# Patient Record
Sex: Female | Born: 1970
Health system: Southern US, Community
[De-identification: ages and names within clinical notes are randomized; demographics above are authoritative.]

## PROBLEM LIST (undated history)

## (undated) DIAGNOSIS — K219 Gastro-esophageal reflux disease without esophagitis: Secondary | ICD-10-CM

## (undated) DIAGNOSIS — F1210 Cannabis abuse, uncomplicated: Secondary | ICD-10-CM

## (undated) DIAGNOSIS — I10 Essential (primary) hypertension: Secondary | ICD-10-CM

## (undated) DIAGNOSIS — Z720 Tobacco use: Secondary | ICD-10-CM

## (undated) DIAGNOSIS — K859 Acute pancreatitis without necrosis or infection, unspecified: Secondary | ICD-10-CM

---

## 2002-05-06 ENCOUNTER — Emergency Department (HOSPITAL_COMMUNITY): Admission: EM | Admit: 2002-05-06 | Discharge: 2002-05-06 | Payer: Self-pay | Admitting: Emergency Medicine

## 2002-05-06 ENCOUNTER — Encounter: Payer: Self-pay | Admitting: Emergency Medicine

## 2002-10-09 ENCOUNTER — Ambulatory Visit (HOSPITAL_COMMUNITY): Admission: RE | Admit: 2002-10-09 | Discharge: 2002-10-09 | Payer: Self-pay | Admitting: *Deleted

## 2002-10-09 ENCOUNTER — Encounter: Admission: RE | Admit: 2002-10-09 | Discharge: 2002-10-09 | Payer: Self-pay | Admitting: *Deleted

## 2019-01-18 ENCOUNTER — Encounter (HOSPITAL_COMMUNITY): Payer: Self-pay | Admitting: Emergency Medicine

## 2019-01-18 ENCOUNTER — Other Ambulatory Visit: Payer: Self-pay

## 2019-01-18 ENCOUNTER — Emergency Department (HOSPITAL_COMMUNITY)
Admission: EM | Admit: 2019-01-18 | Discharge: 2019-01-18 | Disposition: A | Discharge status: Home / Self Care | Payer: Self-pay | Attending: Emergency Medicine | Admitting: Emergency Medicine

## 2019-01-18 DIAGNOSIS — R1013 Epigastric pain: Secondary | ICD-10-CM | POA: Insufficient documentation

## 2019-01-18 MED ORDER — ALUM & MAG HYDROXIDE-SIMETH 200-200-20 MG/5ML PO SUSP
15.0000 mL | Freq: Once | ORAL | Status: AC
  Administered 2019-01-18: 15 mL via ORAL
  Filled 2019-01-18: qty 30

## 2019-01-18 MED ORDER — OMEPRAZOLE 20 MG PO CPDR: 20.0000 mg | DELAYED_RELEASE_CAPSULE | Freq: Every day | ORAL | 0 refills | Status: DC

## 2019-01-18 MED ORDER — SODIUM CHLORIDE 0.9% FLUSH: 3.0000 mL | Freq: Once | INTRAVENOUS | Status: DC

## 2019-01-18 MED ORDER — LIDOCAINE VISCOUS HCL 2 % MT SOLN
15.0000 mL | Freq: Once | OROMUCOSAL | Status: AC
  Administered 2019-01-18: 15 mL via OROMUCOSAL
  Filled 2019-01-18: qty 15

## 2019-01-18 NOTE — Discharge Instructions (Addendum)
Today, we recommended you have lab work drawn, however you declined and requested to go home instead.   You can continue taking the Gaviscon as needed for symptoms. Begin taking the omeprazole daily. Avoid spicy foods, acidic foods, NSAIDs such as ibuprofen/aspirin/Aleve -these can all worsen your symptoms. Remain sitting upright for at least 45 minutes to 1 hour after meals. Establish primary care she can follow-up on your high blood pressure and your recurrent acid reflux symptoms.  Return to the emergency department if you develop severely worsening pain, fever, or new or concerning symptoms.

## 2019-01-18 NOTE — ED Notes (Signed)
Per provider labs are not needed on this patient.

## 2019-01-18 NOTE — ED Triage Notes (Signed)
Per pt, states she is having severe acid reflux-took something but no relief-states symptoms occur when ever she is stressed

## 2019-01-18 NOTE — ED Provider Notes (Signed)
[facility]-EMERGENCY DEPT Provider Note   CSN: [id number] Arrival date & time: 01/18/19  1514    History   Chief Complaint Chief Complaint  Patient presents with  . Abdominal Pain    HPI Sarah Burch is a 48 y.o. female with PMHx GERD, HTN, presenting to the ED with complaint of epigastric burning abdominal pain without radiation.  Symptoms exacerbated by increased stress and anxiety at home.  She states this has caused similar symptoms in the past.  Treated with  gaviscon with some improvement.  She used to take Prevacid for this, however has not purchased any because she is waiting for a paycheck in the next few days.  Feels similar to previous flares of reflux.  Tums are better with laying down and resting, worse with eating, though generally constant burning pain. No hx of abd surgeries. No assoc N/V/D/C, fever, cough.  She does smoke daily.  She has a history of high blood pressure, however has not treated it because she has not had a PCP.     The history is provided by the patient.    History reviewed. No pertinent past medical history.  There are no active problems to display for this patient.   History reviewed. No pertinent surgical history.   OB History   No obstetric history on file.      Home Medications    Prior to Admission medications   Medication Sig Start Date End Date Taking? Authorizing Provider  omeprazole (PRILOSEC) 20 MG capsule Take 1 capsule (20 mg total) by mouth daily. 01/18/19   Jenia Klepper, [place] N, PA-C    Family History No family history on file.  Social History Social History   Tobacco Use  . Smoking status: Not on file  Substance Use Topics  . Alcohol use: Not on file  . Drug use: Not on file     Allergies   Patient has no allergy information on record.   Review of Systems Review of Systems  Constitutional: Negative for fever.  Gastrointestinal: Positive for abdominal pain. Negative for constipation,  diarrhea, nausea and vomiting.  Genitourinary: Negative for dysuria and frequency.  All other systems reviewed and are negative.    Physical Exam Updated Vital Signs BP (!) 173/103   Pulse 91   Temp 98.6 F (37 C)   Resp 18   SpO2 99%   Physical Exam Vitals signs and nursing note reviewed.  Constitutional:      Appearance: She is well-developed.  HENT:     Head: Normocephalic and atraumatic.     Mouth/Throat:     Mouth: Mucous membranes are moist.     Pharynx: Oropharynx is clear.  Eyes:     Conjunctiva/sclera: Conjunctivae normal.  Cardiovascular:     Rate and Rhythm: Regular rhythm. Tachycardia present.     Pulses: Normal pulses.     Heart sounds: Normal heart sounds.  Pulmonary:     Effort: Pulmonary effort is normal. No respiratory distress.     Breath sounds: Normal breath sounds.  Abdominal:     General: Abdomen is flat. Bowel sounds are normal. There is no distension.     Palpations: Abdomen is soft.     Tenderness: There is abdominal tenderness in the epigastric area. There is no guarding or rebound. Negative signs include Murphy's sign.  Skin:    General: Skin is warm.  Neurological:     Mental Status: She is alert.  Psychiatric:  Behavior: Behavior normal.      ED Treatments / Results  Labs (all labs ordered are listed, but only abnormal results are displayed) Labs Reviewed  LIPASE, BLOOD  COMPREHENSIVE METABOLIC PANEL  CBC  URINALYSIS, ROUTINE W REFLEX MICROSCOPIC  I-STAT BETA HCG BLOOD, ED ([facility], [facility], [facility] ONLY)    EKG EKG Interpretation  Date/Time:  Friday January 18 2019 15:25:08 EDT Ventricular Rate:  116 PR Interval:    QRS Duration: 62 QT Interval:  364 QTC Calculation: 506 R Axis:   87 Text Interpretation:  Sinus tachycardia Probable left atrial enlargement Anteroseptal infarct, old Borderline ST depression, diffuse leads Confirmed by Lennice Sites [phone]) on 01/18/2019 4:38:13 PM   Radiology No results found.  Procedures  Procedures (including critical care time)  Medications Ordered in ED Medications  sodium chloride flush (NS) 0.9 % injection 3 mL (has no administration in time range)  alum & mag hydroxide-simeth (MAALOX/MYLANTA) 200-200-20 MG/5ML suspension 15 mL (has no administration in time range)  lidocaine (XYLOCAINE) 2 % viscous mouth solution 15 mL (has no administration in time range)     Initial Impression / Assessment and Plan / ED Course  I have reviewed the triage vital signs and the nursing notes.  Pertinent labs & imaging results that were available during my care of the patient were reviewed by me and considered in my medical decision making (see chart for details).        Patient with history of GERD, presenting with complaint of flare of her GERD which is attributed to increased stress and anxiety at home.  Symptoms are very typical of previous episodes.  She is treated with Gaviscon at home, however has not tried any other over-the-counter medications because she has not had a paycheck yet.  Abdominal exam with some tenderness in the epigastrium, however no peritoneal signs.  Vital signs reveal some slight tachycardia on initial evaluation, however she does appear anxious.  She is also hypertensive though improving throughout visit.  Symptoms suspicious for gastritis versus PUD.  Recommend labs to rule out other causes of symptoms such as pancreatitis however patient declined any lab work today.  Discussed risk of missed diagnosis.  She verbalized understanding.  Patient treated with GI cocktail in the ED, will discharge with PPI and diet modifications.  Return precautions discussed.  Encourage PCP.  Patient discharged.  Patient discussed with Dr. Ronnald Nian.  Discussed results, findings, treatment and follow up. Patient advised of return precautions. Patient verbalized understanding and agreed with plan.   Final Clinical Impressions(s) / ED Diagnoses   Final diagnoses:  Epigastric  abdominal pain    ED Discharge Orders         Ordered    omeprazole (PRILOSEC) 20 MG capsule  Daily     01/18/19 [place], [place] N, [place] 01/18/19 [place], [place], DO 01/18/19 1902

## 2019-01-25 ENCOUNTER — Inpatient Hospital Stay (HOSPITAL_COMMUNITY)
Admission: EM | Admit: 2019-01-25 | Discharge: 2019-01-31 | DRG: 440 | Disposition: A | Discharge status: Home / Self Care | Payer: Self-pay | Attending: Family Medicine | Admitting: Family Medicine

## 2019-01-25 ENCOUNTER — Other Ambulatory Visit: Payer: Self-pay

## 2019-01-25 ENCOUNTER — Encounter (HOSPITAL_COMMUNITY): Payer: Self-pay | Admitting: *Deleted

## 2019-01-25 ENCOUNTER — Emergency Department (HOSPITAL_COMMUNITY): Payer: Self-pay

## 2019-01-25 DIAGNOSIS — K851 Biliary acute pancreatitis without necrosis or infection: Secondary | ICD-10-CM

## 2019-01-25 DIAGNOSIS — K861 Other chronic pancreatitis: Secondary | ICD-10-CM | POA: Diagnosis present

## 2019-01-25 DIAGNOSIS — R1011 Right upper quadrant pain: Secondary | ICD-10-CM

## 2019-01-25 DIAGNOSIS — I160 Hypertensive urgency: Secondary | ICD-10-CM | POA: Diagnosis present

## 2019-01-25 DIAGNOSIS — K859 Acute pancreatitis without necrosis or infection, unspecified: Principal | ICD-10-CM | POA: Diagnosis present

## 2019-01-25 DIAGNOSIS — F17200 Nicotine dependence, unspecified, uncomplicated: Secondary | ICD-10-CM | POA: Diagnosis present

## 2019-01-25 DIAGNOSIS — K219 Gastro-esophageal reflux disease without esophagitis: Secondary | ICD-10-CM | POA: Diagnosis present

## 2019-01-25 DIAGNOSIS — Z20828 Contact with and (suspected) exposure to other viral communicable diseases: Secondary | ICD-10-CM | POA: Diagnosis present

## 2019-01-25 LAB — COMPREHENSIVE METABOLIC PANEL
ALT: 86 U/L — ABNORMAL HIGH (ref 0–44)
AST: 220 U/L — ABNORMAL HIGH (ref 15–41)
Albumin: 3.3 g/dL — ABNORMAL LOW (ref 3.5–5.0)
Alkaline Phosphatase: 98 U/L (ref 38–126)
Anion gap: 9 (ref 5–15)
BUN: 6 mg/dL (ref 6–20)
CO2: 22 mmol/L (ref 22–32)
Calcium: 9 mg/dL (ref 8.9–10.3)
Chloride: 104 mmol/L (ref 98–111)
Creatinine, Ser: 0.77 mg/dL (ref 0.44–1.00)
GFR calc Af Amer: >60 mL/min (ref >60)
GFR calc non Af Amer: >60 mL/min (ref >60)
Glucose, Bld: 119 mg/dL — ABNORMAL HIGH (ref 70–99)
Potassium: 3.8 mmol/L (ref 3.5–5.1)
Sodium: 135 mmol/L (ref 135–145)
Total Bilirubin: 0.9 mg/dL (ref 0.3–1.2)
Total Protein: 6.5 g/dL (ref 6.5–8.1)

## 2019-01-25 LAB — CBC
HCT: 39.5 % (ref 36.0–46.0)
Hemoglobin: 13.7 g/dL (ref 12.0–15.0)
MCH: 35.5 pg — ABNORMAL HIGH (ref 26.0–34.0)
MCHC: 34.7 g/dL (ref 30.0–36.0)
MCV: 102.3 fL — ABNORMAL HIGH (ref 80.0–100.0)
Platelets: 398 10*3/uL (ref 150–400)
RBC: 3.86 MIL/uL — ABNORMAL LOW (ref 3.87–5.11)
RDW: 15.2 % (ref 11.5–15.5)
WBC: 10.7 10*3/uL — ABNORMAL HIGH (ref 4.0–10.5)
nRBC: 0 % (ref 0.0–0.2)

## 2019-01-25 LAB — URINALYSIS, ROUTINE W REFLEX MICROSCOPIC
Bilirubin Urine: NEGATIVE
Glucose, UA: NEGATIVE mg/dL
Hgb urine dipstick: NEGATIVE
Ketones, ur: NEGATIVE mg/dL
Nitrite: NEGATIVE
Protein, ur: 30 mg/dL — AB
Specific Gravity, Urine: 1.033 — ABNORMAL HIGH (ref 1.005–1.030)
WBC, UA: >50 WBC/hpf — ABNORMAL HIGH (ref 0–5)
pH: 5 (ref 5.0–8.0)

## 2019-01-25 LAB — SARS CORONAVIRUS 2 BY RT PCR (HOSPITAL ORDER, PERFORMED IN ~~LOC~~ HOSPITAL LAB): SARS Coronavirus 2: NEGATIVE

## 2019-01-25 LAB — I-STAT BETA HCG BLOOD, ED (MC, WL, AP ONLY): I-stat hCG, quantitative: 7.6 m[IU]/mL — ABNORMAL HIGH (ref <5)

## 2019-01-25 LAB — TRIGLYCERIDES: Triglycerides: 64 mg/dL (ref <150)

## 2019-01-25 LAB — LIPASE, BLOOD: Lipase: 207 U/L — ABNORMAL HIGH (ref 11–51)

## 2019-01-25 LAB — PREGNANCY, URINE: Preg Test, Ur: NEGATIVE

## 2019-01-25 MED ORDER — ACETAMINOPHEN 650 MG RE SUPP: 650.0000 mg | Freq: Four times a day (QID) | RECTAL | Status: DC | PRN

## 2019-01-25 MED ORDER — MORPHINE SULFATE (PF) 4 MG/ML IV SOLN
4.0000 mg | Freq: Once | INTRAVENOUS | Status: AC
  Administered 2019-01-25: 17:00:00 4 mg via INTRAVENOUS
  Filled 2019-01-25: qty 1

## 2019-01-25 MED ORDER — ONDANSETRON HCL 4 MG/2ML IJ SOLN
4.0000 mg | Freq: Four times a day (QID) | INTRAMUSCULAR | Status: DC | PRN
  Administered 2019-01-25 – 2019-01-26 (×3): 4 mg via INTRAVENOUS
  Filled 2019-01-25 (×3): qty 2

## 2019-01-25 MED ORDER — SUCRALFATE 1 GM/10ML PO SUSP
1.0000 g | Freq: Three times a day (TID) | ORAL | Status: DC
  Administered 2019-01-25 – 2019-01-31 (×17): 1 g via ORAL
  Filled 2019-01-25 (×16): qty 10

## 2019-01-25 MED ORDER — ONDANSETRON HCL 4 MG PO TABS: 4.0000 mg | ORAL_TABLET | Freq: Four times a day (QID) | ORAL | Status: DC | PRN

## 2019-01-25 MED ORDER — ENOXAPARIN SODIUM 40 MG/0.4ML ~~LOC~~ SOLN: 40.0000 mg | SUBCUTANEOUS | Status: DC

## 2019-01-25 MED ORDER — DEXTROSE IN LACTATED RINGERS 5 % IV SOLN
INTRAVENOUS | Status: DC
  Administered 2019-01-25 – 2019-01-30 (×14): via INTRAVENOUS

## 2019-01-25 MED ORDER — SODIUM CHLORIDE 0.9% FLUSH: 3.0000 mL | Freq: Once | INTRAVENOUS | Status: DC

## 2019-01-25 MED ORDER — SODIUM CHLORIDE 0.9 % IV BOLUS
1000.0000 mL | Freq: Once | INTRAVENOUS | Status: AC
  Administered 2019-01-25: 1000 mL via INTRAVENOUS

## 2019-01-25 MED ORDER — ONDANSETRON HCL 4 MG/2ML IJ SOLN
4.0000 mg | Freq: Once | INTRAMUSCULAR | Status: AC
  Administered 2019-01-25: 17:00:00 4 mg via INTRAVENOUS
  Filled 2019-01-25: qty 2

## 2019-01-25 MED ORDER — MORPHINE SULFATE (PF) 2 MG/ML IV SOLN
2.0000 mg | INTRAVENOUS | Status: DC | PRN
  Administered 2019-01-25 – 2019-01-30 (×37): 2 mg via INTRAVENOUS
  Filled 2019-01-25 (×37): qty 1

## 2019-01-25 MED ORDER — ACETAMINOPHEN 325 MG PO TABS: 650.0000 mg | ORAL_TABLET | Freq: Four times a day (QID) | ORAL | Status: DC | PRN

## 2019-01-25 MED ORDER — PANTOPRAZOLE SODIUM 40 MG IV SOLR
40.0000 mg | Freq: Two times a day (BID) | INTRAVENOUS | Status: DC
  Administered 2019-01-25 – 2019-01-30 (×11): 40 mg via INTRAVENOUS
  Filled 2019-01-25 (×11): qty 40

## 2019-01-25 NOTE — ED Provider Notes (Signed)
[facility] EMERGENCY [facility]PARTMENT Provider Note   CSN: [id number] Arrival date & time: 01/25/19  1318    History   Chief Complaint Chief Complaint  Patient presents with  . Abdominal Pain    HPI Sarah Burch is a 48 y.o. female.     48 yo F with a cc of epigastric abdominal pain.  Going on for about a week.  Worse with eating.  Described as a ache.  Pain is severe at times does not seem to go away.  Has some nausea and vomiting.  Denies diarrhea.  Denies urinary symptoms.  Has some discomfort in her back.  Has had issues like this previously and was diagnosed as reflux disease.  She has been trying Protonix without improvement.  The about a week ago at that time she declined lab work.  The history is provided by the patient.  Abdominal Pain Pain location:  Epigastric Pain quality: sharp and shooting   Pain radiates to:  Does not radiate Pain severity:  Moderate Onset quality:  Gradual Duration:  2 days Timing:  Constant Progression:  Worsening Chronicity:  New Relieved by:  Nothing Worsened by:  Nothing Ineffective treatments:  None tried Associated symptoms: nausea and vomiting   Associated symptoms: no chest pain, no chills, no dysuria, no fever and no shortness of breath     History reviewed. No pertinent past medical history.  There are no active problems to display for this patient.   History reviewed. No pertinent surgical history.   OB History   No obstetric history on file.      Home Medications    Prior to Admission medications   Medication Sig Start Date End Date Taking? Authorizing Provider  omeprazole (PRILOSEC) 20 MG capsule Take 1 capsule (20 mg total) by mouth daily. 01/18/19   Rob[place], [place] N, PA-C    Family History No family history on file.  Social History Social History   Tobacco Use  . Smoking status: Current Every Day Smoker  . Smokeless tobacco: Never Used  Substance Use Topics  . Alcohol use: Yes  . Drug  use: Never     Allergies   Codeine   Review of Systems Review of Systems  Constitutional: Negative for chills and fever.  HENT: Negative for congestion and rhinorrhea.   Eyes: Negative for redness and visual disturbance.  Respiratory: Negative for shortness of breath and wheezing.   Cardiovascular: Negative for chest pain and palpitations.  Gastrointestinal: Positive for abdominal pain, nausea and vomiting.  Genitourinary: Negative for dysuria and urgency.  Musculoskeletal: Negative for arthralgias and myalgias.  Skin: Negative for pallor and wound.  Neurological: Negative for dizziness and headaches.     Physical Exam Updated Vital Signs BP (!) 189/109   Pulse 87   Temp 98.3 F (36.8 C) (Oral)   Resp 16   LMP 09/25/2018 (LMP Unknown)   SpO2 100%   Physical Exam Vitals signs and nursing note reviewed.  Constitutional:      General: She is not in acute distress.    Appearance: She is well-developed. She is not diaphoretic.  HENT:     Head: Normocephalic and atraumatic.  Eyes:     Pupils: Pupils are equal, round, and reactive to light.  Neck:     Musculoskeletal: Normal range of motion and neck supple.  Cardiovascular:     Rate and Rhythm: Normal rate and regular rhythm.     Heart sounds: No murmur. No friction rub. No gallop.  Pulmonary:     Effort: Pulmonary effort is normal.     Breath sounds: No wheezing or rales.  Abdominal:     General: There is no distension.     Palpations: Abdomen is soft.     Tenderness: There is abdominal tenderness (epigastric and LUQ, no appreciable RUQ pain).  Musculoskeletal:        General: No tenderness.  Skin:    General: Skin is warm and dry.  Neurological:     Mental Status: She is alert and oriented to person, place, and time.  Psychiatric:        Behavior: Behavior normal.      ED Treatments / Results  Labs (all labs ordered are listed, but only abnormal results are displayed) Labs Reviewed  LIPASE, BLOOD -  Abnormal; Notable for the following components:      Result Value   Lipase 207 (*)    All other components within normal limits  COMPREHENSIVE METABOLIC PANEL - Abnormal; Notable for the following components:   Glucose, Bld 119 (*)    Albumin 3.3 (*)    AST 220 (*)    ALT 86 (*)    All other components within normal limits  CBC - Abnormal; Notable for the following components:   WBC 10.7 (*)    RBC 3.86 (*)    MCV 102.3 (*)    MCH 35.5 (*)    All other components within normal limits  URINALYSIS, ROUTINE W REFLEX MICROSCOPIC - Abnormal; Notable for the following components:   APPearance CLOUDY (*)    Specific Gravity, Urine 1.033 (*)    Protein, ur 30 (*)    Leukocytes,Ua LARGE (*)    WBC, UA >50 (*)    Bacteria, UA MANY (*)    All other components within normal limits  I-STAT BETA HCG BLOOD, ED ([facility], [facility], [facility] ONLY) - Abnormal; Notable for the following components:   I-stat hCG, quantitative 7.6 (*)    All other components within normal limits  SARS CORONAVIRUS 2 (HOSPITAL ORDER, [facility])  TRIGLYCERIDES  PREGNANCY, URINE    EKG None  Radiology US Abdomen Limited Ruq  Result Date: 01/25/2019 CLINICAL DATA:  RIGHT upper quadrant abdominal pain for 2 weeks. EXAM: ULTRASOUND ABDOMEN LIMITED RIGHT UPPER QUADRANT COMPARISON:  None. FINDINGS: Gallbladder: Small amount of sludge within the gallbladder. No gallstones seen. No gallbladder wall thickening or pericholecystic fluid. No sonographic Murphy's sign elicited per the sonographer. Common bile duct: Diameter: 1 mm Liver: No focal lesion identified. Within normal limits in parenchymal echogenicity. Portal vein is patent on color Doppler imaging with normal direction of blood flow towards the liver. IMPRESSION: 1. No acute findings.  No gallstones.  No evidence of cholecystitis. 2. Small amount of sludge within the gallbladder. 3. Liver appears normal. Electronically Signed   By: Franki Cabot M.D.   On:  01/25/2019 16:34    Procedures Procedures (including critical care time)  Medications Ordered in ED Medications  sodium chloride flush (NS) 0.9 % injection 3 mL (has no administration in time range)  sodium chloride 0.9 % bolus 1,000 mL (1,000 mLs Intravenous New Bag/Given 01/25/19 1648)  morphine 4 MG/ML injection 4 mg (4 mg Intravenous Given 01/25/19 1650)  ondansetron (ZOFRAN) injection 4 mg (4 mg Intravenous Given 01/25/19 1648)     Initial Impression / Assessment and Plan / ED Course  I have reviewed the triage vital signs and the nursing notes.  Pertinent labs & imaging results  that were available during my care of the patient were reviewed by me and considered in my medical decision making (see chart for details).        48 yo F with a chief complaint of epigastric abdominal pain.  This been going on for about a week or so.  Patient had lab work done in triage is consistent with acute pancreatitis.  Mild LFT elevation.  Will obtain a right upper quadrant ultrasound.  Give pain and nausea medicine.  Right upper quad ultrasound without acute cholecystitis, no stones no gallbladder wall thickening.  Patient has some sludge noted.  Patient reassessed after pain and nausea medicine and and still has severe pain.  Will give another dose of pain medicine.  Discussed with medicine for admission.  The patients results and plan were reviewed and discussed.   Any x-rays performed were independently reviewed by myself.   Differential diagnosis were considered with the presenting HPI.  Medications  sodium chloride flush (NS) 0.9 % injection 3 mL (has no administration in time range)  sodium chloride 0.9 % bolus 1,000 mL (1,000 mLs Intravenous New Bag/Given 01/25/19 1648)  morphine 4 MG/ML injection 4 mg (4 mg Intravenous Given 01/25/19 1650)  ondansetron (ZOFRAN) injection 4 mg (4 mg Intravenous Given 01/25/19 1648)    Vitals:   01/25/19 1425 01/25/19 1600  BP: (!) 190/107 (!) 189/109   Pulse: 87   Resp: 16   Temp: 98.3 F (36.8 C)   TempSrc: Oral   SpO2: 100%     Final diagnoses:  Abdominal pain, RUQ  Acute biliary pancreatitis, unspecified complication status    Admission/ observation were discussed with the admitting physician, patient and/or family and they are comfortable with the plan.    Final Clinical Impressions(s) / ED Diagnoses   Final diagnoses:  Abdominal pain, RUQ  Acute biliary pancreatitis, unspecified complication status    ED Discharge Orders    None       Melene PlanFloyd, Nickolis Diel, DO 01/25/19 1724

## 2019-01-25 NOTE — ED Notes (Signed)
ED TO INPATIENT HANDOFF REPORT  ED Nurse Name and Phone #: Wyline CopasLouie,RN [phone]  S Name/Age/Gender Sarah Burch 48 y.o. female Room/Bed: 013C/013C  Code Status   Code Status: Not on file  Home/SNF/Other Home Patient oriented to: situation Is this baseline? Yes   Triage Complete: Triage complete  Chief Complaint Stomach Pain  Triage Note To ED for eval of generalized abd pain for past 2 wks. No vomiting. Denies difficulty with urination. States pain hasn't become worse or better. Food makes her feel nauseated.    Allergies Allergies  Allergen Reactions  . Codeine     Level of Care/Admitting Diagnosis ED Disposition    ED Disposition Condition Comment   Admit  [facility] [[id number]]  Level of Care: Med-Surg [16]  I expect the patient will be discharged within 24 hours: Yes  LOW acuity---Tx typically complete <24 hrs---ACUTE conditions typically can be evaluated <24 hours---LABS likely to return to acceptable levels <24 hours---IS near functional baseline---EXPECTED to return to current living arrangement---NOT newly hypoxic: Meets criteria for 5C-Observation unit  Covid Evaluation: N/A  Diagnosis: Pancreatitis [[id number]]  Admitting Physician: Coralie KeensARRIEN, MAURICIO DANIEL [[id number]]  Attending Physician: Coralie KeensARRIEN, MAURICIO DANIEL [[id number]]  PT Class (Do Not Modify): Observation [104]  PT Acc Code (Do Not Modify): Observation [10022]       B Medical/Surgery History History reviewed. No pertinent past medical history. History reviewed. No pertinent surgical history.   A IV Location/Drains/Wounds Patient Lines/Drains/Airways Status   Active Line/Drains/Airways    Name:   Placement date:   Placement time:   Site:   Days:   Peripheral IV 01/25/19 Left Forearm   01/25/19    1643    Forearm   less than 1          Intake/Output Last 24 hours No intake or output data in the 24 hours ending 01/25/19 1741  Labs/Imaging Results for orders placed  or performed during the hospital encounter of 01/25/19 (from the past 48 hour(s))  Urinalysis, Routine w reflex microscopic     Status: Abnormal   Collection Time: 01/25/19  1:33 PM  Result Value Ref Range   Color, Urine YELLOW YELLOW   APPearance CLOUDY (A) CLEAR   Specific Gravity, Urine 1.033 (H) 1.005 - 1.030   pH 5.0 5.0 - 8.0   Glucose, UA NEGATIVE NEGATIVE mg/dL   Hgb urine dipstick NEGATIVE NEGATIVE   Bilirubin Urine NEGATIVE NEGATIVE   Ketones, ur NEGATIVE NEGATIVE mg/dL   Protein, ur 30 (A) NEGATIVE mg/dL   Nitrite NEGATIVE NEGATIVE   Leukocytes,Ua LARGE (A) NEGATIVE   WBC, UA >50 (H) 0 - 5 WBC/hpf   Bacteria, UA MANY (A) NONE SEEN   Squamous Epithelial / LPF 0-5 0 - 5   WBC Clumps PRESENT     Comment: Performed at [facility], [address] [place], [place] [address]  Lipase, blood     Status: Abnormal   Collection Time: 01/25/19  1:43 PM  Result Value Ref Range   Lipase 207 (H) 11 - 51 U/L    Comment: Performed at [facility], [address] [place], [place] [address]  Comprehensive metabolic panel     Status: Abnormal   Collection Time: 01/25/19  1:43 PM  Result Value Ref Range   Sodium 135 135 - 145 mmol/L   Potassium 3.8 3.5 - 5.1 mmol/L   Chloride 104 98 - 111 mmol/L   CO2 22 22 - 32 mmol/L  Glucose, Bld 119 (H) 70 - 99 mg/dL   BUN 6 6 - 20 mg/dL   Creatinine, Ser 0.77 0.44 - 1.00 mg/dL   Calcium 9.0 8.9 - 10.3 mg/dL   Total Protein 6.5 6.5 - 8.1 g/dL   Albumin 3.3 (L) 3.5 - 5.0 g/dL   AST 220 (H) 15 - 41 U/L   ALT 86 (H) 0 - 44 U/L   Alkaline Phosphatase 98 38 - 126 U/L   Total Bilirubin 0.9 0.3 - 1.2 mg/dL   GFR calc non Af Amer >60 >60 mL/min   GFR calc Af Amer >60 >60 mL/min   Anion gap 9 5 - 15    Comment: Performed at [facility] [address], [place], [place] [address]  CBC     Status: Abnormal   Collection Time: 01/25/19  1:43 PM  Result Value Ref Range   WBC 10.7 (H) 4.0 - 10.5 K/uL   RBC 3.86 (L) 3.87 - 5.11  MIL/uL   Hemoglobin 13.7 12.0 - 15.0 g/dL   HCT 39.5 36.0 - 46.0 %   MCV 102.3 (H) 80.0 - 100.0 fL   MCH 35.5 (H) 26.0 - 34.0 pg   MCHC 34.7 30.0 - 36.0 g/dL   RDW 15.2 11.5 - 15.5 %   Platelets 398 150 - 400 K/uL   nRBC 0.0 0.0 - 0.2 %    Comment: Performed at [facility], [place] [address] [place], [place] [address]  Triglycerides     Status: None   Collection Time: 01/25/19  1:43 PM  Result Value Ref Range   Triglycerides 64 <150 mg/dL    Comment: Performed at [facility] [address] [place], [place] [address]  I-Stat beta hCG blood, ED     Status: Abnormal   Collection Time: 01/25/19  1:54 PM  Result Value Ref Range   I-stat hCG, quantitative 7.6 (H) <5 mIU/mL   Comment 3            Comment:   GEST. AGE      CONC.  (mIU/mL)   <=1 WEEK        5 - 50     2 WEEKS       50 - 500     3 WEEKS       100 - 10,000     4 WEEKS     1,000 - 30,000        FEMALE AND NON-PREGNANT FEMALE:     LESS THAN 5 mIU/mL    US Abdomen Limited Ruq  Result Date: 01/25/2019 CLINICAL DATA:  RIGHT upper quadrant abdominal pain for 2 weeks. EXAM: ULTRASOUND ABDOMEN LIMITED RIGHT UPPER QUADRANT COMPARISON:  None. FINDINGS: Gallbladder: Small amount of sludge within the gallbladder. No gallstones seen. No gallbladder wall thickening or pericholecystic fluid. No sonographic Murphy's sign elicited per the sonographer. Common bile duct: Diameter: 1 mm Liver: No focal lesion identified. Within normal limits in parenchymal echogenicity. Portal vein is patent on color Doppler imaging with normal direction of blood flow towards the liver. IMPRESSION: 1. No acute findings.  No gallstones.  No evidence of cholecystitis. 2. Small amount of sludge within the gallbladder. 3. Liver appears normal. Electronically Signed   By: Franki Cabot M.D.   On: 01/25/2019 16:34    Pending Labs Unresulted Labs (From admission, onward)    Start     Ordered   01/25/19 1721  SARS Coronavirus 2 (CEPHEID - Performed in  [facility] lab), Hosp Order  (Asymptomatic Patients Labs)  Once,   STAT    Question:  Rule Out  Answer:  Yes   01/25/19 1720   01/25/19 1538  Pregnancy, urine  ONCE - STAT,   STAT     01/25/19 1537   Signed and Held  HIV antibody (Routine Testing)  Once,   R     Signed and Held   Signed and Held  CBC  (enoxaparin (LOVENOX)    CrCl >/= 30 ml/min)  Once,   R    Comments: Baseline for enoxaparin therapy IF NOT ALREADY DRAWN.  Notify MD if PLT < 100 K.    Signed and Held   Signed and Held  Creatinine, serum  (enoxaparin (LOVENOX)    CrCl >/= 30 ml/min)  Once,   R    Comments: Baseline for enoxaparin therapy IF NOT ALREADY DRAWN.    Signed and Held   Signed and Held  Creatinine, serum  (enoxaparin (LOVENOX)    CrCl >/= 30 ml/min)  Weekly,   R    Comments: while on enoxaparin therapy    Signed and Held   Signed and Held  Basic metabolic panel  Tomorrow morning,   R     Signed and Held   Signed and Held  CBC  Tomorrow morning,   R     Signed and Held          Vitals/Pain Today's Vitals   01/25/19 1331 01/25/19 1425 01/25/19 1600 01/25/19 1652  BP:  (!) 190/107 (!) 189/109   Pulse:  87    Resp:  16    Temp:  98.3 F (36.8 C)    TempSrc:  Oral    SpO2:  100%    PainSc: 8    9     Isolation Precautions No active isolations  Medications Medications  sodium chloride flush (NS) 0.9 % injection 3 mL (has no administration in time range)  sodium chloride 0.9 % bolus 1,000 mL (1,000 mLs Intravenous New Bag/Given 01/25/19 1648)  morphine 4 MG/ML injection 4 mg (4 mg Intravenous Given 01/25/19 1650)  ondansetron (ZOFRAN) injection 4 mg (4 mg Intravenous Given 01/25/19 1648)    Mobility walks Low fall risk   Focused Assessments Pulmonary Assessment Handoff:  Lung sounds:   O2 Device: Room Air        R Recommendations: See Admitting Provider Note  Report given to:   Additional Notes: Here for abdominal pain; -US except sludge.

## 2019-01-25 NOTE — ED Triage Notes (Signed)
To ED for eval of generalized abd pain for past 2 wks. No vomiting. Denies difficulty with urination. States pain hasn't become worse or better. Food makes her feel nauseated.

## 2019-01-25 NOTE — ED Notes (Signed)
Pt in ultrasound at this time

## 2019-01-25 NOTE — H&P (Signed)
History and Physical    MISTI HOYE XBJ:[id number] DOB: 1971-03-12 DOA: 01/25/2019  PCP: Patient, No Pcp Per   Patient coming from: Home   Chief Complaint: Abdominal pain.   HPI: WINSTON SINN is a 48 y.o. female with medical history significant of gastroesophageal reflux disease.  He presents with history of constant epigastric abdominal pain for the last 2 weeks, moderate to severe in intensity, burning/sharp in nature, no radiation, worse with meals, no improving factors, associated with nausea but no vomiting.  Denies any over-the-counter medications, denies any recent alcohol consumption.  Her p.o. intake has significantly decreased due to abdominal pain   ED Course: Patient was found in significant pain, hypertensive, her lipase was elevated, she was diagnosed with pancreatitis and referred for admission for evaluation.  Review of Systems:  1. General: No fevers, no chills, no weight gain or weight loss 2. ENT: No runny nose or sore throat, no hearing disturbances 3. Pulmonary: No dyspnea, cough, wheezing, or hemoptysis 4. Cardiovascular: No angina, claudication, lower extremity edema, pnd or orthopnea 5. Gastrointestinal: Positive for abdominal pain and nausea no vomiting, no diarrhea or constipation 6. Hematology: No easy bruisability or frequent infections 7. Urology: No dysuria, hematuria or increased urinary frequency 8. Dermatology: No rashes. 9. Neurology: No seizures or paresthesias 10. Musculoskeletal: No joint pain or deformities  History reviewed. No pertinent past medical history.  History reviewed. No pertinent surgical history.   reports that she has been smoking. She has never used smokeless tobacco. She reports current alcohol use. She reports that she does not use drugs.  Allergies  Allergen Reactions  . Codeine     No family history on file. Family history positive for diabetes mellitus.    Prior to Admission medications   Medication Sig Start Date  End Date Taking? Authorizing Provider  omeprazole (PRILOSEC) 20 MG capsule Take 1 capsule (20 mg total) by mouth daily. 01/18/19   Robinson, [place] N, PA-C    Physical Exam: Vitals:   01/25/19 1425 01/25/19 1600  BP: (!) 190/107 (!) 189/109  Pulse: 87   Resp: 16   Temp: 98.3 F (36.8 C)   TempSrc: Oral   SpO2: 100%     Vitals:   01/25/19 1425 01/25/19 1600  BP: (!) 190/107 (!) 189/109  Pulse: 87   Resp: 16   Temp: 98.3 F (36.8 C)   TempSrc: Oral   SpO2: 100%    General: deconditioned and in pain Neurology: Awake and alert, non focal Head and Neck. Head normocephalic. Neck supple with no adenopathy or thyromegaly.   E ENT: mild pallor, no icterus, oral mucosa dry Cardiovascular: No JVD. S1-S2 present, rhythmic, no gallops, rubs, or murmurs. No lower extremity edema. Pulmonary: positive breath sounds bilaterally, adequate air movement, no wheezing, rhonchi or rales. Gastrointestinal. Abdomen flat, no organomegaly, tender to superficial palpation, no rebound or guarding Skin. No rashes Musculoskeletal: no joint deformities    Labs on Admission: I have personally reviewed following labs and imaging studies  CBC: Recent Labs  Lab 01/25/19 1343  WBC 10.7*  HGB 13.7  HCT 39.5  MCV 102.3*  PLT 398   Basic Metabolic Panel: Recent Labs  Lab 01/25/19 1343  NA 135  K 3.8  CL 104  CO2 22  GLUCOSE 119*  BUN 6  CREATININE 0.77  CALCIUM 9.0   GFR: CrCl cannot be calculated (Unknown ideal weight.). Liver Function Tests: Recent Labs  Lab 01/25/19 1343  AST 220*  ALT 86*  ALKPHOS  98  BILITOT 0.9  PROT 6.5  ALBUMIN 3.3*   Recent Labs  Lab 01/25/19 1343  LIPASE 207*   No results for input(s): AMMONIA in the last 168 hours. Coagulation Profile: No results for input(s): INR, PROTIME in the last 168 hours. Cardiac Enzymes: No results for input(s): CKTOTAL, CKMB, CKMBINDEX, TROPONINI in the last 168 hours. BNP (last 3 results) No results for input(s):  PROBNP in the last 8760 hours. HbA1C: No results for input(s): HGBA1C in the last 72 hours. CBG: No results for input(s): GLUCAP in the last 168 hours. Lipid Profile: Recent Labs    01/25/19 1343  TRIG 64   Thyroid Function Tests: No results for input(s): TSH, T4TOTAL, FREET4, T3FREE, THYROIDAB in the last 72 hours. Anemia Panel: No results for input(s): VITAMINB12, FOLATE, FERRITIN, TIBC, IRON, RETICCTPCT in the last 72 hours. Urine analysis:    Component Value Date/Time   COLORURINE YELLOW 01/25/2019 1333   APPEARANCEUR CLOUDY (A) 01/25/2019 1333   LABSPEC 1.033 (H) 01/25/2019 1333   PHURINE 5.0 01/25/2019 1333   GLUCOSEU NEGATIVE 01/25/2019 1333   HGBUR NEGATIVE 01/25/2019 1333   BILIRUBINUR NEGATIVE 01/25/2019 1333   KETONESUR NEGATIVE 01/25/2019 1333   PROTEINUR 30 (A) 01/25/2019 1333   NITRITE NEGATIVE 01/25/2019 1333   LEUKOCYTESUR LARGE (A) 01/25/2019 1333    Radiological Exams on Admission: US Abdomen Limited Ruq  Result Date: 01/25/2019 CLINICAL DATA:  RIGHT upper quadrant abdominal pain for 2 weeks. EXAM: ULTRASOUND ABDOMEN LIMITED RIGHT UPPER QUADRANT COMPARISON:  None. FINDINGS: Gallbladder: Small amount of sludge within the gallbladder. No gallstones seen. No gallbladder wall thickening or pericholecystic fluid. No sonographic Murphy's sign elicited per the sonographer. Common bile duct: Diameter: 1 mm Liver: No focal lesion identified. Within normal limits in parenchymal echogenicity. Portal vein is patent on color Doppler imaging with normal direction of blood flow towards the liver. IMPRESSION: 1. No acute findings.  No gallstones.  No evidence of cholecystitis. 2. Small amount of sludge within the gallbladder. 3. Liver appears normal. Electronically Signed   By: Bary Richard M.D.   On: 01/25/2019 16:34    EKG: Independently reviewed. NA   Assessment/Plan Active Problems:   Pancreatitis  48 year old female who presents with persistent abdominal pain for  the last 2 weeks, associated with nausea and poor oral intake.  Her abdominal pain is worse with meals.  Denies any over-the-counter drugs or recent alcohol consumption.  On her initial physical examination her temperature is 98.3, blood pressure 190/107, pulse rate 87, respiratory rate 16, oxygen saturation 90% on room air.  Dry mucous membranes, lungs clear to auscultation bilaterally, heart S1-S2 present and rhythmic, abdomen tender to superficial palpation, no lower extremity edema.  Sodium 135, potassium 3.8, chloride 104, bicarb 22, glucose 119, BUN 6, creatinine 0.77, lipase 207, AST 220, ALT 86, triglycerides 64, white count 10.7, hemoglobin 13.7, hematocrit 39.5, platelets 398, urinalysis has more than 50 white cells, specific gravity 1.033, 30 protein.  Right upper quadrant ultrasound with no gallstones, no acute findings, no evidence of cholecystitis.  Patient will be admitted to the hospital working diagnosis of acute pancreatitis.  1.  Acute pancreatitis.  Patient with very poor oral intake due to abdominal pain, she will be placed in the medical ward, start on intravenous fluids with D5 and lactated Ringer's at 100 mL/h, antiacid therapy with IV pantoprazole, as needed pain control with IV morphine and as needed antiemetics with Zofran.  Will place patient a clear liquid diet advance as tolerated.  She denies any alcohol consumption but her AST to ALT elevation pattern are suggestive of alcohol.  Her abdominal ultrasound negative for gallstones.  COVID-19 need to be ruled out, since it has been associated with gastrointestinal symptoms. Follow LFT in am.   2.  GERD.  Patient will be placed on pantoprazole twice daily and will add sucralfate.  3.  Tobacco abuse.  Smoking cessation counseling.  DVT prophylaxis: enoxaparin  Code Status: full  Family Communication: no family at the bedside   Disposition Plan: med surg   Consults called: none   Admission status: Observation.     Kloey Cazarez  Annett Gula MD [facility]   01/25/2019, 5:42 PM

## 2019-01-25 NOTE — ED Notes (Signed)
6N requested Covid results prior to transfer. Informed RN pt has no respiratory complaint at this time.

## 2019-01-25 NOTE — ED Notes (Signed)
6N unable to take report at this time 

## 2019-01-26 ENCOUNTER — Encounter (HOSPITAL_COMMUNITY): Payer: Self-pay

## 2019-01-26 ENCOUNTER — Other Ambulatory Visit: Payer: Self-pay

## 2019-01-26 DIAGNOSIS — K859 Acute pancreatitis without necrosis or infection, unspecified: Secondary | ICD-10-CM

## 2019-01-26 DIAGNOSIS — K219 Gastro-esophageal reflux disease without esophagitis: Secondary | ICD-10-CM

## 2019-01-26 LAB — BASIC METABOLIC PANEL
Anion gap: 8 (ref 5–15)
BUN: 5 mg/dL — ABNORMAL LOW (ref 6–20)
CO2: 23 mmol/L (ref 22–32)
Calcium: 8.7 mg/dL — ABNORMAL LOW (ref 8.9–10.3)
Chloride: 107 mmol/L (ref 98–111)
Creatinine, Ser: 0.68 mg/dL (ref 0.44–1.00)
GFR calc Af Amer: >60 mL/min (ref >60)
GFR calc non Af Amer: >60 mL/min (ref >60)
Glucose, Bld: 114 mg/dL — ABNORMAL HIGH (ref 70–99)
Potassium: 3.8 mmol/L (ref 3.5–5.1)
Sodium: 138 mmol/L (ref 135–145)

## 2019-01-26 LAB — CBC
HCT: 39.5 % (ref 36.0–46.0)
Hemoglobin: 13.4 g/dL (ref 12.0–15.0)
MCH: 35.2 pg — ABNORMAL HIGH (ref 26.0–34.0)
MCHC: 33.9 g/dL (ref 30.0–36.0)
MCV: 103.7 fL — ABNORMAL HIGH (ref 80.0–100.0)
Platelets: 367 10*3/uL (ref 150–400)
RBC: 3.81 MIL/uL — ABNORMAL LOW (ref 3.87–5.11)
RDW: 15.4 % (ref 11.5–15.5)
WBC: 11.5 10*3/uL — ABNORMAL HIGH (ref 4.0–10.5)
nRBC: 0 % (ref 0.0–0.2)

## 2019-01-26 LAB — HEPATIC FUNCTION PANEL
ALT: 119 U/L — ABNORMAL HIGH (ref 0–44)
AST: 260 U/L — ABNORMAL HIGH (ref 15–41)
Albumin: 2.9 g/dL — ABNORMAL LOW (ref 3.5–5.0)
Alkaline Phosphatase: 89 U/L (ref 38–126)
Bilirubin, Direct: 0.2 mg/dL (ref 0.0–0.2)
Indirect Bilirubin: 0.5 mg/dL (ref 0.3–0.9)
Total Bilirubin: 0.7 mg/dL (ref 0.3–1.2)
Total Protein: 5.7 g/dL — ABNORMAL LOW (ref 6.5–8.1)

## 2019-01-26 LAB — HIV ANTIBODY (ROUTINE TESTING W REFLEX): HIV Screen 4th Generation wRfx: NONREACTIVE

## 2019-01-26 MED ORDER — PRO-STAT SUGAR FREE PO LIQD
30.0000 mL | Freq: Two times a day (BID) | ORAL | Status: DC
  Administered 2019-01-26 – 2019-01-27 (×2): 30 mL via ORAL
  Filled 2019-01-26 (×2): qty 30

## 2019-01-26 MED ORDER — ADULT MULTIVITAMIN W/MINERALS CH
1.0000 | ORAL_TABLET | Freq: Every day | ORAL | Status: DC
  Administered 2019-01-26: 1 via ORAL
  Filled 2019-01-26: qty 1

## 2019-01-26 MED ORDER — BOOST / RESOURCE BREEZE PO LIQD CUSTOM
1.0000 | Freq: Three times a day (TID) | ORAL | Status: DC
  Administered 2019-01-26 (×2): 1 via ORAL

## 2019-01-26 MED ORDER — HYDRALAZINE HCL 20 MG/ML IJ SOLN
10.0000 mg | Freq: Four times a day (QID) | INTRAMUSCULAR | Status: DC | PRN
  Administered 2019-01-26 – 2019-01-31 (×5): 10 mg via INTRAVENOUS
  Filled 2019-01-26 (×5): qty 1

## 2019-01-26 NOTE — Progress Notes (Signed)
PROGRESS NOTE  Sarah MARRISON JJO:[id number] DOB: [birth year] DOA: 01/25/2019 PCP: Patient, No Pcp Per  Brief History   48 year old woman PMH GERD presented with 2-week history of epigastric abdominal pain, admitted for acute pancreatitis.  A & P  Acute pancreatitis, unclear etiology.  Triglycerides unremarkable.  Right upper quadrant ultrasound was unremarkable appearance of gallbladder and liver.  No gallstones.  Common bile duct caliber normal.  Drinks infrequently, last approximately 3 weeks ago. --Significant nausea and abdominal pain with liquids.  Lipase slightly higher.  Make n.p.o.  Aggressive IV fluids.  Strict I's/O. --Elevated transaminases of unclear significance.  Total bilirubin and alkaline phosphatase within normal limits.  Liver appeared normal on ultrasound.  No gallstones seen in common bile duct unremarkable.  Denies alcohol intake in the last 2 weeks.  Check hepatitis panel. --Check CMP and lipase in a.m. --Continue analgesia and supportive care  GERD --Continue PPI   DVT prophylaxis: SCDs Code Status: Full Family Communication: none Disposition Plan: home    Brendia Sacks, MD  [facility] Direct contact: see [email or web address] (further directions at bottom of note if needed) 7PM-7AM contact night coverage as at bottom of note 01/26/2019, 12:03 PM  LOS: 0 days   Consultants  .   Procedures   Antibiotics  .   Interval History/Subjective  Significant nausea, having pain when trying to drink liquids.  Feels poorly.  Objective   Vitals:  Vitals:   01/26/19 0420 01/26/19 0621  BP: (!) 184/109 (!) 155/83  Pulse: 83 93  Resp: 20   Temp: 98.2 F (36.8 C)   SpO2: 99%     Exam:  Constitutional:  . Appears calm, uncomfortable, nontoxic Eyes:  Marland Kitchen Appear grossly unremarkable ENMT:  . grossly normal hearing  Respiratory:  . CTA bilaterally, no w/r/r.  . Respiratory effort normal. Cardiovascular:  . RRR, no m/r/g . No LE extremity edema   Abdomen:   . Soft.  Moderate epigastric pain. Psychiatric:  . Mental status o Mood, affect appropriate . judgment and insight appear intact     I have personally reviewed the following:   Today's Data  . Lipase slightly higher, 260 . BMP unremarkable . AST 260, ALT 119 . Total bilirubin and alkaline phosphatase within normal limits . Minimal leukocytosis is stable, 1.5.  Hemoglobin 13.4.  Lab Data  .   Micro Data  .   Imaging  . Right upper quadrant ultrasound unremarkable  Cardiology Data  .   Other Data  .   Scheduled Meds: . feeding supplement  1 Container Oral TID BM  . feeding supplement (PRO-STAT SUGAR FREE 64)  30 mL Oral BID WC  . pantoprazole (PROTONIX) IV  40 mg Intravenous Q12H  . sodium chloride flush  3 mL Intravenous Once  . sucralfate  1 g Oral TID WC & HS   Continuous Infusions: . dextrose 5% lactated ringers 100 mL/hr at 01/26/19 1601    Principal Problem:   Acute pancreatitis Active Problems:   GERD (gastroesophageal reflux disease)   LOS: 0 days   How to contact the [facility] Attending or Consulting provider 7A - 7P or covering provider during after hours 7P -7A, for this patient?  1. Check the care team in [facility] and look for a) attending/consulting TRH provider listed and b) the [facility] team listed 2. Log into [email or web address] and use [place]'s universal password to access. If you do not have the password, please contact the hospital operator. 3. Locate the [facility] provider you are looking  for under [facility] and page to a number that you can be directly reached. 4. If you still have difficulty reaching the provider, please page the [facility] (Director on Call) for the Hospitalists listed on amion for assistance.

## 2019-01-26 NOTE — Progress Notes (Signed)
 Initial Nutrition Assessment   RD working remotely.   DOCUMENTATION CODES:   Not applicable  INTERVENTION:   Boost Breeze po TID, each supplement provides 250 kcal and 9 grams of protein  30 ml Prostat BID, each supplement provides 100 kcals and 15 grams protein.   Add MVI with Minerals  NUTRITION DIAGNOSIS:   Inadequate oral intake related to altered GI function, acute illness as evidenced by per patient/family report.  GOAL:   Patient will meet greater than or equal to 90% of their needs  MONITOR:   PO intake, Supplement acceptance, Diet advancement, Labs, Weight trends  REASON FOR ASSESSMENT:   Malnutrition Screening Tool    ASSESSMENT:   48 yo female with PMH significant for GERD presents with abdominal pain x 2 weeks with nausea and poor po intake, no vomiting and worsens with meals and admitted with acute pancreatitis. Pt denies EtOH consumption   Pt is hypertensive; BP 184/109 this AM  Unable to reach pt via phone this AM  Pt ate 50% of CL tray this AM. Pt with poor po intake x 2 weeks PTA.   No previous weight encounters; current wt 41.6 kg  Labs: reviewed, lipase 207 Meds: D5-LR at 100 ml/hr   NUTRITION - FOCUSED PHYSICAL EXAM:  Unable to assess, working remotely  Diet Order:   Diet Order            Diet clear liquid Room service appropriate? Yes; Fluid consistency: Thin  Diet effective now              EDUCATION NEEDS:   Not appropriate for education at this time  Skin:  Skin Assessment: Reviewed RN Assessment  Last BM:  7/11  Height:   Ht Readings from Last 1 Encounters:  01/25/19 4\' 11"  (1.499 m)    Weight:   Wt Readings from Last 1 Encounters:  01/25/19 41.6 kg    Ideal Body Weight:  44 kg  BMI:  Body mass index is 18.52 kg/m.  Estimated Nutritional Needs:   Kcal:  1550-1750 kcals  Protein:  75-88 g  Fluid:  >/= 1.5 L    Tashon Capp MS, RDN, LDN, CNSC [phone] Pager  [phone] Weekend/On-Call  Pager

## 2019-01-26 NOTE — Progress Notes (Signed)
Notified B. Kyere, NP of patient's B/P being 184/109. Received a PRN order for Hydralazine. Will implement and continue to monitor.

## 2019-01-26 NOTE — Plan of Care (Signed)

## 2019-01-27 LAB — COMPREHENSIVE METABOLIC PANEL
ALT: 73 U/L — ABNORMAL HIGH (ref 0–44)
AST: 90 U/L — ABNORMAL HIGH (ref 15–41)
Albumin: 2.4 g/dL — ABNORMAL LOW (ref 3.5–5.0)
Alkaline Phosphatase: 76 U/L (ref 38–126)
Anion gap: 6 (ref 5–15)
BUN: <5 mg/dL — ABNORMAL LOW (ref 6–20)
CO2: 25 mmol/L (ref 22–32)
Calcium: 8.3 mg/dL — ABNORMAL LOW (ref 8.9–10.3)
Chloride: 108 mmol/L (ref 98–111)
Creatinine, Ser: 0.69 mg/dL (ref 0.44–1.00)
GFR calc Af Amer: >60 mL/min (ref >60)
GFR calc non Af Amer: >60 mL/min (ref >60)
Glucose, Bld: 92 mg/dL (ref 70–99)
Potassium: 3.7 mmol/L (ref 3.5–5.1)
Sodium: 139 mmol/L (ref 135–145)
Total Bilirubin: 0.8 mg/dL (ref 0.3–1.2)
Total Protein: 5 g/dL — ABNORMAL LOW (ref 6.5–8.1)

## 2019-01-27 LAB — LIPASE, BLOOD: Lipase: 120 U/L — ABNORMAL HIGH (ref 11–51)

## 2019-01-27 NOTE — Progress Notes (Signed)
PROGRESS NOTE  Sarah Burch YQM:[id number] DOB: 06/15/1971 DOA: 01/25/2019 PCP: Patient, No Pcp Per  Brief History   48 year old woman PMH GERD presented with 2-week history of epigastric abdominal pain, admitted for acute pancreatitis.  A & P  Acute pancreatitis, unclear etiology.  Triglycerides unremarkable.  Right upper quadrant ultrasound was unremarkable appearance of gallbladder and liver.  No gallstones.  Common bile duct caliber normal.  Drinks infrequently, last approximately 3 weeks ago. --About the same today.  Still having nausea and pain.  No opportunity to advance diet yet.  Continue fluids, antiemetics and analgesia as needed. --Lipase and LFTs trending down.  Suppose it is possible she could have passed a stone but her right upper quadrant ultrasound was without any evidence of stones, common bile duct dilatation or liver abnormalities.  We will follow-up hepatitis panel otherwise just follow clinically.  GERD --Continue PPI   DVT prophylaxis: SCDs Code Status: Full Family Communication: none Disposition Plan: home    Murray Hodgkins, MD  [facility] Direct contact: see [email or web address] (further directions at bottom of note if needed) 7PM-7AM contact night coverage as at bottom of note 01/27/2019, 9:48 AM  LOS: 1 day   Consultants  .   Procedures   Antibiotics  .   Interval History/Subjective  Had pain again yesterday afternoon and evening.  Was given some protein and Ensure to drink.  Feels okay today.  Still has epigastric pain.  No other issues reported except nausea.  She endorses situational stress and wonders if this is the cause of pancreatitis.  Objective   Vitals:  Vitals:   01/26/19 2124 01/27/19 0515  BP: (!) 164/86 137/81  Pulse: (!) 103 80  Resp: 16 16  Temp: 98.3 F (36.8 C) 98.6 F (37 C)  SpO2: 100% 98%    Exam:  Constitutional:   . Appears calm and comfortable Respiratory:  . CTA bilaterally, no w/r/r.  . Respiratory effort  normal.  Cardiovascular:  . RRR, no m/r/g . No LE extremity edema   Abdomen:  . Soft.  Epigastric pain with palpation. Psychiatric:  . Mental status o Mood, affect appropriate . judgment and insight appear intact     I have personally reviewed the following:   Today's Data  . Basic metabolic panel unremarkable. . Lipase trending down, 120 . AST and ALT trending down.  Alkaline phosphatase and total bilirubin within normal limits.  Lab Data  .   Micro Data  .   Imaging  . Right upper quadrant ultrasound unremarkable  Cardiology Data  .   Other Data  .   Scheduled Meds: . pantoprazole (PROTONIX) IV  40 mg Intravenous Q12H  . sodium chloride flush  3 mL Intravenous Once  . sucralfate  1 g Oral TID WC & HS   Continuous Infusions: . dextrose 5% lactated ringers 150 mL/hr at 01/27/19 5284    Principal Problem:   Acute pancreatitis Active Problems:   GERD (gastroesophageal reflux disease)   Pancreatitis   LOS: 1 day   How to contact the [facility] [address] Attending or Consulting provider [place] or covering provider during after hours [place], for this patient?  1. Check the care team in [facility] and look for a) attending/consulting TRH provider listed and b) the [facility] team listed 2. Log into [email or web address] and use [place]'s universal password to access. If you do not have the password, please contact the hospital operator. 3. Locate the [facility] provider you are looking for under [facility]  and page to a number that you can be directly reached. 4. If you still have difficulty reaching the provider, please page the [facility] (Director on Call) for the Hospitalists listed on amion for assistance.

## 2019-01-28 LAB — COMPREHENSIVE METABOLIC PANEL
ALT: 54 U/L — ABNORMAL HIGH (ref 0–44)
AST: 52 U/L — ABNORMAL HIGH (ref 15–41)
Albumin: 2.4 g/dL — ABNORMAL LOW (ref 3.5–5.0)
Alkaline Phosphatase: 77 U/L (ref 38–126)
Anion gap: 6 (ref 5–15)
BUN: 5 mg/dL — ABNORMAL LOW (ref 6–20)
CO2: 26 mmol/L (ref 22–32)
Calcium: 8.8 mg/dL — ABNORMAL LOW (ref 8.9–10.3)
Chloride: 107 mmol/L (ref 98–111)
Creatinine, Ser: 0.57 mg/dL (ref 0.44–1.00)
GFR calc Af Amer: >60 mL/min (ref >60)
GFR calc non Af Amer: >60 mL/min (ref >60)
Glucose, Bld: 76 mg/dL (ref 70–99)
Potassium: 3.8 mmol/L (ref 3.5–5.1)
Sodium: 139 mmol/L (ref 135–145)
Total Bilirubin: 0.7 mg/dL (ref 0.3–1.2)
Total Protein: 5.2 g/dL — ABNORMAL LOW (ref 6.5–8.1)

## 2019-01-28 LAB — HEPATITIS PANEL, ACUTE
HCV Ab: 0.1 s/co ratio (ref 0.0–0.9)
Hep A IgM: NEGATIVE
Hep B C IgM: NEGATIVE
Hepatitis B Surface Ag: NEGATIVE

## 2019-01-28 LAB — LIPASE, BLOOD: Lipase: 51 U/L (ref 11–51)

## 2019-01-28 NOTE — TOC Initial Note (Signed)
Transition of Care [facility]) - Initial/Assessment Note    Patient Details  Name: Sarah Burch MRN: [id number] Date of Birth: [birth year]  [facility] CM/SW Contact:    Marilu Favre, RN Phone Number: 01/28/2019, 12:00 PM  Clinical Narrative:                  Patient and her husband Juanda Crumble just moved back to [place] from [place]. They will be staying with her sister at : [address] , [place], [place]   Patient is uninsured. Discussed and explained Transitions of Care pharmacy and [facility] . Patient voiced understanding. Patient does not have a PCP and wants to follow up with [facility] and Wellness scheduled appointment for February 06, 2019 at 1:30 pm .  Will continue to follow. Expected Discharge Plan: Home/Self Care Barriers to Discharge: Continued Medical Work up   Patient Goals and CMS Choice Patient states their goals for this hospitalization and ongoing recovery are:: to go home CMS Medicare.gov Compare Post Acute Care list provided to:: Patient Choice offered to / list presented to : NA  Expected Discharge Plan and Services Expected Discharge Plan: Home/Self Care In-house Referral: Development worker, community, PCP / Health Connect Discharge Planning Services: CM Consult, [facility], [place], Medication Assistance   Living arrangements for the past 2 months: Single Family Home                 DME Arranged: N/A         HH Arranged: NA          Prior Living Arrangements/Services Living arrangements for the past 2 months: Single Family Home Lives with:: Spouse Patient language and need for interpreter reviewed:: Yes        Need for Family Participation in Patient Care: No (Comment) Care giver support system in place?: Yes (comment)   Criminal Activity/Legal Involvement Pertinent to Current Situation/Hospitalization: No - Comment as needed  Activities of Daily Living Home Assistive Devices/Equipment: None ADL Screening  (condition at time of admission) Patient's cognitive ability adequate to safely complete daily activities?: Yes Is the patient deaf or have difficulty hearing?: No Does the patient have difficulty seeing, even when wearing glasses/contacts?: No Does the patient have difficulty concentrating, remembering, or making decisions?: No Patient able to express need for assistance with ADLs?: Yes Does the patient have difficulty dressing or bathing?: No Independently performs ADLs?: Yes (appropriate for developmental age) Does the patient have difficulty walking or climbing stairs?: No Weakness of Legs: None Weakness of Arms/Hands: None  Permission Sought/Granted Permission sought to share information with : Family Supports Permission granted to share information with : Yes, Verbal Permission Granted  Share Information with NAME: Juanda Crumble husband  Permission granted to share info w AGENCY: Scientist, research (physical sciences) and Wellness  Permission granted to share info w Relationship: husband     Emotional Assessment Appearance:: Appears stated age Attitude/Demeanor/Rapport: Engaged Affect (typically observed): Accepting Orientation: : Oriented to Self, Oriented to Place, Oriented to  Time, Oriented to Situation Alcohol / Substance Use: Not Applicable Psych Involvement: No (comment)  Admission diagnosis:  Abdominal pain, RUQ [R10.11] Acute biliary pancreatitis, unspecified complication status [O13.08] Patient Active Problem List   Diagnosis Date Noted  . Acute pancreatitis 01/26/2019  . GERD (gastroesophageal reflux disease) 01/26/2019  . Pancreatitis 01/26/2019   PCP:  Patient, No Pcp Per Pharmacy:   [place], [place]  [facility] [address] [place] [id number] Phone: [phone] Fax: [phone]  [facility] [place] [address] [place]  [id number] Phone: [phone] Fax: [phone]     Social Determinants of Health (SDOH) Interventions    Readmission Risk Interventions No flowsheet data found.

## 2019-01-28 NOTE — Progress Notes (Signed)
  PROGRESS NOTE  Sarah Burch:[id number] DOB: 03/21/1971 DOA: 01/25/2019 PCP: Patient, No Pcp Per  Brief History   48 year old woman PMH GERD presented with 2-week history of epigastric abdominal pain, admitted for acute pancreatitis.  A & P  Acute pancreatitis, unclear etiology.  Triglycerides unremarkable.  Right upper quadrant ultrasound was unremarkable appearance of gallbladder and liver.  No gallstones.  Common bile duct caliber normal.  Drinks infrequently, last approximately 3 weeks ago. --Somewhat improved with a still having significant abdominal pain, not yet ready to advance diet.  Lipase and LFTs trending down.  Possible that a stone was passed but her right upper quadrant ultrasound did not show any stones in her common bile duct was unremarkable.  No liver abnormality seen on imaging.  Continue IV fluids, antiemetics and analgesia as needed.  GERD --Continue PPI   DVT prophylaxis: SCDs Code Status: Full Family Communication: none Disposition Plan: home    Murray Hodgkins, MD  [facility] Direct contact: see [email or web address] (further directions at bottom of note if needed) 7PM-7AM contact night coverage as at bottom of note 01/28/2019, 10:19 AM  LOS: 2 days   Consultants  .   Procedures   Antibiotics  .   Interval History/Subjective  Feels better but still has significant abdominal pain.  Has been able to space out pain medication to every 4 hours.  Objective   Vitals:  Vitals:   01/27/19 2240 01/28/19 0552  BP: (!) 142/95 (!) 138/93  Pulse: 87 77  Resp:  14  Temp:  98.4 F (36.9 C)  SpO2:  99%    Exam:  Constitutional:   . Appears calm and comfortable Respiratory:  . CTA bilaterally, no w/r/r.  . Respiratory effort normal.  Cardiovascular:  . RRR, no m/r/g . No LE extremity edema   Abdomen:  . Soft, moderate epigastric tenderness to palpation Psychiatric:  . Mental status o Mood, affect appropriate  I have personally reviewed the  following:   Today's Data  . Multiple voids . Patient metabolic panel unremarkable.  Lipase trending down, 51.  AST and ALT trending down, 52 and 54 respectively.  Total bilirubin within normal limits.  Lab Data  .   Micro Data  .   Imaging  . Right upper quadrant ultrasound unremarkable  Cardiology Data  .   Other Data  .   Scheduled Meds: . pantoprazole (PROTONIX) IV  40 mg Intravenous Q12H  . sodium chloride flush  3 mL Intravenous Once  . sucralfate  1 g Oral TID WC & HS   Continuous Infusions: . dextrose 5% lactated ringers 150 mL/hr at 01/28/19 3149    Principal Problem:   Acute pancreatitis Active Problems:   GERD (gastroesophageal reflux disease)   Pancreatitis   LOS: 2 days   How to contact the [facility] Attending or Consulting provider [place] or covering provider during after hours [place], for this patient?  1. Check the care team in [facility] and look for a) attending/consulting TRH provider listed and b) the [facility] team listed 2. Log into [email or web address] and use [place]'s universal password to access. If you do not have the password, please contact the hospital operator. 3. Locate the [facility] provider you are looking for under [facility] and page to a number that you can be directly reached. 4. If you still have difficulty reaching the provider, please page the [facility] (Director on Call) for the Hospitalists listed on amion for assistance.

## 2019-01-29 LAB — BASIC METABOLIC PANEL
Anion gap: 6 (ref 5–15)
BUN: <5 mg/dL — ABNORMAL LOW (ref 6–20)
CO2: 26 mmol/L (ref 22–32)
Calcium: 8.7 mg/dL — ABNORMAL LOW (ref 8.9–10.3)
Chloride: 107 mmol/L (ref 98–111)
Creatinine, Ser: 0.59 mg/dL (ref 0.44–1.00)
GFR calc Af Amer: >60 mL/min (ref >60)
GFR calc non Af Amer: >60 mL/min (ref >60)
Glucose, Bld: 79 mg/dL (ref 70–99)
Potassium: 4.1 mmol/L (ref 3.5–5.1)
Sodium: 139 mmol/L (ref 135–145)

## 2019-01-29 LAB — LIPASE, BLOOD: Lipase: 42 U/L (ref 11–51)

## 2019-01-29 NOTE — Plan of Care (Signed)

## 2019-01-29 NOTE — Progress Notes (Signed)
  PROGRESS NOTE  Sarah Burch RSW:[id number] DOB: June 26, 1971 DOA: 01/25/2019 PCP: Patient, No Pcp Per  Brief History   48 year old woman PMH GERD presented with 2-week history of epigastric abdominal pain, admitted for acute pancreatitis.  A & P  Acute pancreatitis, unclear etiology.  Triglycerides unremarkable.  Right upper quadrant ultrasound was unremarkable appearance of gallbladder and liver.  No gallstones.  Common bile duct caliber normal.  Drinks infrequently, last approximately 3 weeks ago. --Significantly improved today, mild residual pain.  Lipase continues to trend down.  Will advance to clear liquids today.  If tolerates can probably go to full liquids tomorrow.'s possible the stone was passed but her right upper quadrant ultrasound did not show any stones (only sludge) and her common bile duct was unremarkable.  Regardless she is improving. --Continue antiemetics and pain medication as needed. --Slow down IV fluids  GERD --Continue PPI  If continues to improve, can probably advance to full liquids tomorrow.  DVT prophylaxis: SCDs Code Status: Full Family Communication: none Disposition Plan: home    Murray Hodgkins, MD  [facility] Direct contact: see [email or web address] (further directions at bottom of note if needed) 7PM-7AM contact night coverage as at bottom of note 01/29/2019, 4:03 PM  LOS: 3 days   Consultants  .   Procedures   Antibiotics  .   Interval History/Subjective  Significantly better today, some epigastric pain but much less.  Hungry and would like to try liquids.  Objective   Vitals:  Vitals:   01/29/19 0523 01/29/19 1229  BP: (!) 171/96 (!) 166/99  Pulse: 72 82  Resp: 18   Temp: 98.3 F (36.8 C) 97.8 F (36.6 C)  SpO2: 99% 100%    Exam:  Constitutional:   . Appears calm and comfortable Respiratory:  . CTA bilaterally, no w/r/r.  . Respiratory effort normal. Cardiovascular:  . RRR, no m/r/g . No LE extremity edema   Abdomen:   . Soft, nondistended, minimal epigastric tenderness Psychiatric:  . Mental status o Mood, affect appropriate  I have personally reviewed the following:   Today's Data  . I's/O not accurate . BMP unremarkable . Lipase 42  Lab Data  .   Micro Data  .   Imaging  . Right upper quadrant ultrasound unremarkable  Cardiology Data  .   Other Data  .   Scheduled Meds: . pantoprazole (PROTONIX) IV  40 mg Intravenous Q12H  . sodium chloride flush  3 mL Intravenous Once  . sucralfate  1 g Oral TID WC & HS   Continuous Infusions: . dextrose 5% lactated ringers 150 mL/hr at 01/29/19 0938    Principal Problem:   Acute pancreatitis Active Problems:   GERD (gastroesophageal reflux disease)   Pancreatitis   LOS: 3 days   How to contact the [facility] Attending or Consulting provider [place] or covering provider during after hours [place], for this patient?  1. Check the care team in [facility] and look for a) attending/consulting TRH provider listed and b) the [facility] team listed 2. Log into [email or web address] and use [place]'s universal password to access. If you do not have the password, please contact the hospital operator. 3. Locate the [facility] provider you are looking for under [facility] and page to a number that you can be directly reached. 4. If you still have difficulty reaching the provider, please page the [facility] (Director on Call) for the Hospitalists listed on amion for assistance.

## 2019-01-30 LAB — LIPASE, BLOOD: Lipase: 41 U/L (ref 11–51)

## 2019-01-30 NOTE — Progress Notes (Addendum)
  PROGRESS NOTE  Sarah Burch:[id number] DOB: 1971/06/08 DOA: 01/25/2019 PCP: Patient, No Pcp Per  Brief History   48 year old woman PMH GERD presented with 2-week history of epigastric abdominal pain, admitted for acute pancreatitis.  A & P  Acute pancreatitis, unclear etiology.  Triglycerides unremarkable.  Right upper quadrant ultrasound was unremarkable appearance of gallbladder and liver.  No gallstones.  Common bile duct caliber normal.  Drinks infrequently, last approximately 3 weeks ago. --Significantly improved today, mild residual pain. 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 --DC IV fluid  GERD --Continue PPI  If continues to improve, possibly can discharge her home [birth year] tomorrow :-)  DVT prophylaxis: SCDs Code Status: Full Family Communication: none Disposition Plan: home    Verneita Griffes, MD [facility] 1:58 PM  01/30/2019, 1:32 PM  LOS: 4 days   Consultants  .   Procedures   Antibiotics  .   Interval History/Subjective  "When can I eat" Anxious to get home Pain levels 4/10 but did awaken with pain overnight  Objective   Vitals:  Vitals:   01/29/19 2038 01/30/19 0446  BP: (!) 179/103 (!) 145/93  Pulse: 80 97  Resp: 17 18  Temp: 98.8 F (37.1 C) 99.4 F (37.4 C)  SpO2: 99% 95%    Exam:  o Calm comfortable coherent-looks much older than stated age with plethoric features and s solar keratosis o No submandibular lymphadenopathy or icterus pallor no JVD o Chest is clear without added sound o S1-S2 no murmur o Abdomen tender in epigastrium no rebound however o Neurologically intact   I have personally reviewed the following:   Today's Data  .   Lab Data  .   Micro Data  .   Imaging  . Right upper quadrant ultrasound unremarkable  Cardiology  Data  .   Other Data  .   Scheduled Meds: . pantoprazole (PROTONIX) IV  40 mg Intravenous Q12H  . sodium chloride flush  3 mL Intravenous Once  . sucralfate  1 g Oral TID WC & HS   Continuous Infusions: . dextrose 5% lactated ringers 50 mL/hr at 01/30/19 1216    Principal Problem:   Acute pancreatitis Active Problems:   GERD (gastroesophageal reflux disease)   Pancreatitis   LOS: 4 days   How to contact the [facility] Attending or Consulting provider [place] or covering provider during after hours [place], for this patient?  1. Check the care team in [facility] and look for a) attending/consulting TRH provider listed and b) the [facility] team listed 2. Log into [email or web address] and use [place]'s universal password to access. If you do not have the password, please contact the hospital operator. 3. Locate the [facility] provider you are looking for under [facility] and page to a number that you can be directly reached. 4. If you still have difficulty reaching the provider, please page the [facility] (Director on Call) for the Hospitalists listed on amion for assistance.

## 2019-01-31 MED ORDER — AMLODIPINE BESYLATE 10 MG PO TABS: 10.0000 mg | ORAL_TABLET | Freq: Every day | ORAL | 3 refills | Status: DC

## 2019-01-31 MED ORDER — OMEPRAZOLE 20 MG PO CPDR: 20.0000 mg | DELAYED_RELEASE_CAPSULE | Freq: Every day | ORAL | 3 refills | Status: DC

## 2019-01-31 MED FILL — AMLODIPINE BESYLATE 10 MG T: 10 | 30 days supply | Qty: 30 | Fill #0

## 2019-01-31 NOTE — Discharge Summary (Signed)
Physician Discharge Summary  Sarah Burch AVW:[id number] DOB: 1970/08/01 DOA: 01/25/2019  PCP: Patient, No Pcp Per  Admit date: 01/25/2019 Discharge date: 01/31/2019  Time spent: 20 minutes  Recommendations for Outpatient Follow-up:  1. New Rx for Norvasc, Prilosec given and patient to establish with PCP for follow-up  Discharge Diagnoses:  Principal Problem:   Acute pancreatitis Active Problems:   GERD (gastroesophageal reflux disease)   Pancreatitis   Discharge Condition: Improved  Diet recommendation: Soft and full liquids for now graduate to regular soft diet in several days  Filed Weights   01/25/19 1942  Weight: 41.6 kg    Brief History   48 year old woman PMH GERD presented with 2-week history of epigastric abdominal pain, admitted for acute pancreatitis.  A & P  Acute pancreatitis, unclear etiology.  Triglycerides unremarkable.  Right upper quadrant ultrasound was unremarkable appearance of gallbladder and liver.  No gallstones.  Common bile duct caliber normal.  Drinks infrequently, last approximately 3 weeks ago. --Significantly improved on discharge and given instructions for Tylenol OTC ibuprofen OTC for pain --Etiology will continue to be cryptogenic-CBD was not dilated nonsuggestive of CBD stone --No further vomiting  GERD --Continue PPI and given prescription of the same on discharge for follow-up  Hypertension urgency --[facility] [place] [facility] that her blood pressure was cured-has noticed at home blood pressures were high and even here in the hospital it has been high so prescribed Norvasc 10 on discharge   Discharge Exam: Vitals:   01/31/19 0550 01/31/19 0658  BP: (!) 173/107 (!) 160/94  Pulse: 85 95  Resp: 18   Temp: 98.6 F (37 C)   SpO2: 94%     General: Awake alert coherent solar keratosis no distress poor dentition no pallor no icterus Cardiovascular: S1-S2 no murmur Respiratory: Clinically clear no  added sound Abdomen soft but slightly tender in epigastrium No lower extremity edema  Discharge Instructions   Discharge Instructions    Diet - low sodium heart healthy   Complete by: As directed    Discharge instructions   Complete by: As directed    Please be careful with your diet and eat and drink slowly but surely over the next week to week and a half I have prescribed for your blood pressure medication in addition to an acid reducer which will help You should have an appointment with the transitional clinic and I have sent your prescriptions over there as per your paperwork and you can pick them up on discharge Should you have persisting nausea and vomiting back down completely to just plain liquids for period of time and then resume a soft diet again slowly at home   Increase activity slowly   Complete by: As directed      Allergies as of 01/31/2019      Reactions   Codeine Other (See Comments)   Cause dizziness      Medication List    STOP taking these medications   Gaviscon Extra Strength 160-105 MG Chew Generic drug: Alum Hydroxide-Mag Carbonate   omeprazole 20 MG tablet Commonly known as: PRILOSEC OTC     TAKE these medications   acetaminophen 325 MG tablet Commonly known as: TYLENOL Take 650 mg by mouth every 6 (six) hours as needed for headache (pain).   amLODipine 10 MG tablet Commonly known as: NORVASC Take 1 tablet (10 mg total) by mouth daily.   omeprazole 20 MG capsule Commonly known as: PRILOSEC Take 1  capsule (20 mg total) by mouth daily.      Allergies  Allergen Reactions  . Codeine Other (See Comments)    Cause dizziness   Follow-up Information    [facility] Follow up.   Why: February 06, 2019 at 1:30 pm  Contact information: [address] [place] [id number] [phone]           The results of significant diagnostics from this hospitalization (including imaging, microbiology,  ancillary and laboratory) are listed below for reference.    Significant Diagnostic Studies: Koreas Abdomen Limited Ruq  Result Date: 01/25/2019 CLINICAL DATA:  RIGHT upper quadrant abdominal pain for 2 weeks. EXAM: ULTRASOUND ABDOMEN LIMITED RIGHT UPPER QUADRANT COMPARISON:  None. FINDINGS: Gallbladder: Small amount of sludge within the gallbladder. No gallstones seen. No gallbladder wall thickening or pericholecystic fluid. No sonographic Murphy's sign elicited per the sonographer. Common bile duct: Diameter: 1 mm Liver: No focal lesion identified. Within normal limits in parenchymal echogenicity. Portal vein is patent on color Doppler imaging with normal direction of blood flow towards the liver. IMPRESSION: 1. No acute findings.  No gallstones.  No evidence of cholecystitis. 2. Small amount of sludge within the gallbladder. 3. Liver appears normal. Electronically Signed   By: Bary RichardStan  Maynard M.D.   On: 01/25/2019 16:34    Microbiology: Recent Results (from the past 240 hour(s))  SARS Coronavirus 2 (CEPHEID - Performed in [facility] lab), Hosp Order     Status: None   Collection Time: 01/25/19  6:03 PM   Specimen: Nasopharyngeal Swab  Result Value Ref Range Status   SARS Coronavirus 2 NEGATIVE NEGATIVE Final    Comment: (NOTE) If result is NEGATIVE SARS-CoV-2 target nucleic acids are NOT DETECTED. The SARS-CoV-2 RNA is generally detectable in upper and lower  respiratory specimens during the acute phase of infection. The lowest  concentration of SARS-CoV-2 viral copies this assay can detect is 250  copies / mL. A negative result does not preclude SARS-CoV-2 infection  and should not be used as the sole basis for treatment or other  patient management decisions.  A negative result may occur with  improper specimen collection / handling, submission of specimen other  than nasopharyngeal swab, presence of viral mutation(s) within the  areas targeted by this assay, and inadequate number  of viral copies  (<250 copies / mL). A negative result must be combined with clinical  observations, patient history, and epidemiological information. If result is POSITIVE SARS-CoV-2 target nucleic acids are DETECTED. The SARS-CoV-2 RNA is generally detectable in upper and lower  respiratory specimens dur ing the acute phase of infection.  Positive  results are indicative of active infection with SARS-CoV-2.  Clinical  correlation with patient history and other diagnostic information is  necessary to determine patient infection status.  Positive results do  not rule out bacterial infection or co-infection with other viruses. If result is PRESUMPTIVE POSTIVE SARS-CoV-2 nucleic acids MAY BE PRESENT.   A presumptive positive result was obtained on the submitted specimen  and confirmed on repeat testing.  While 2019 novel coronavirus  (SARS-CoV-2) nucleic acids may be present in the submitted sample  additional confirmatory testing may be necessary for epidemiological  and / or clinical management purposes  to differentiate between  SARS-CoV-2 and other Sarbecovirus currently known to infect humans.  If clinically indicated additional testing with an alternate test  methodology [id number]) is advised. The SARS-CoV-2 RNA is generally  detectable in upper and  lower respiratory sp ecimens during the acute  phase of infection. The expected result is Negative. Fact Sheet for Patients:  StrictlyIdeas.no Fact Sheet for Healthcare Providers: BankingDealers.co.za This test is not yet approved or cleared by the [place] FDA and has been authorized for detection and/or diagnosis of SARS-CoV-2 by FDA under an Emergency Use Authorization (EUA).  This EUA will remain in effect (meaning this test can be used) for the duration of the COVID-19 declaration under Section 564(b)(1) of the Act, 21 U.S.C. section 360bbb-3(b)(1), unless the authorization is  terminated or revoked sooner. Performed at [facility], [place] [address] [place], [place] [address]      Labs: Basic Metabolic Panel: Recent Labs  Lab 01/25/19 1343 01/26/19 0121 01/27/19 0317 01/28/19 0256 01/29/19 0146  NA 135 138 139 139 139  K 3.8 3.8 3.7 3.8 4.1  CL 104 107 108 107 107  CO2 22 23 25 26 26   GLUCOSE 119* 114* 92 76 79  BUN 6 5* <5* 5* <5*  CREATININE 0.77 0.68 0.69 0.57 0.59  CALCIUM 9.0 8.7* 8.3* 8.8* 8.7*   Liver Function Tests: Recent Labs  Lab 01/25/19 1343 01/26/19 0121 01/27/19 0317 01/28/19 0256  AST 220* 260* 90* 52*  ALT 86* 119* 73* 54*  ALKPHOS 98 89 76 77  BILITOT 0.9 0.7 0.8 0.7  PROT 6.5 5.7* 5.0* 5.2*  ALBUMIN 3.3* 2.9* 2.4* 2.4*   Recent Labs  Lab 01/25/19 1343 01/27/19 0317 01/28/19 0256 01/29/19 0146 01/30/19 0125  LIPASE 207* 120* 51 42 41   No results for input(s): AMMONIA in the last 168 hours. CBC: Recent Labs  Lab 01/25/19 1343 01/26/19 0121  WBC 10.7* 11.5*  HGB 13.7 13.4  HCT 39.5 39.5  MCV 102.3* 103.7*  PLT 398 367   Cardiac Enzymes: No results for input(s): CKTOTAL, CKMB, CKMBINDEX, TROPONINI in the last 168 hours. BNP: BNP (last 3 results) No results for input(s): BNP in the last 8760 hours.  ProBNP (last 3 results) No results for input(s): PROBNP in the last 8760 hours.  CBG: No results for input(s): GLUCAP in the last 168 hours.     Signed:  Nita Sells MD   [facility] 01/31/2019, 9:24 AM

## 2019-01-31 NOTE — Plan of Care (Signed)
  Problem: Clinical Measurements: Goal: Ability to maintain clinical measurements within normal limits will improve 01/31/2019 0132 by Kennith Center, RN Outcome: Progressing 01/31/2019 0132 by Kennith Center, RN Outcome: Progressing   Problem: Activity: Goal: Risk for activity intolerance will decrease Outcome: Adequate for Discharge   Problem: Nutrition: Goal: Adequate nutrition will be maintained 01/31/2019 0132 by Kennith Center, RN Outcome: Adequate for Discharge 01/31/2019 0132 by Kennith Center, RN Outcome: Adequate for Discharge   Problem: Elimination: Goal: Will not experience complications related to bowel motility 01/31/2019 0132 by Kennith Center, RN Outcome: Adequate for Discharge 01/31/2019 0132 by Kennith Center, RN Outcome: Adequate for Discharge   Problem: Pain Managment: Goal: General experience of comfort will improve 01/31/2019 0132 by Kennith Center, RN Outcome: Progressing 01/31/2019 0132 by Kennith Center, RN Outcome: Progressing

## 2019-02-01 NOTE — Progress Notes (Signed)
Patient called to department with c/o nausea and vomiting throughout the night. Dr. Verlon Au notified. New prescription for Zofran ODT 4 mg every 6 hours scheduled. No refills. Called in to community pharmacy.

## 2019-02-05 NOTE — Progress Notes (Signed)
Patient ID: Sarah Burch, female   DOB: 1970-11-10, 48 y.o.   MRN: [id number]   Virtual Visit via Telephone Note  I connected with Sarah ShyJulia L Stachnik on 02/06/19 at  1:30 PM EDT by telephone and verified that I am speaking with the correct person using two identifiers.   I discussed the limitations, risks, security and privacy concerns of performing an evaluation and management service by telephone and the availability of in person appointments. I also discussed with the patient that there may be a patient responsible charge related to this service. The patient expressed understanding and agreed to proceed.  Patient location:  home My Location:  [facility] office Persons on the call:  Me and the patient  History of Present Illness:  After hospitalization fro m7/10-7/16/2020 for pancreatitis.  She is having some pain and nausea today after eating some greasy food yesterday.  Today she is feeling a little better.  No fever.  Pain was only mild yesterday.    She does not have a BP cuff at home and hasn't checked her BP since hospitalization.  No HA/CP/dizziness.  Tolerating amlodipine without any problems.    Today she has a c/o abscessed tooth.  She has had this before and knows she needs to get dental work done but can't currently bc her dentist is closed due to Coronavirus.  L side of face is a little swollen, it is an upper back tooth with swelling around the gum.  No fever.  No sinus pain/pressure.    From discharge summary A/P: Acute pancreatitis, unclear etiology. Triglycerides unremarkable. Right upper quadrant ultrasound was unremarkable appearance of gallbladder and liver. No gallstones. Common bile duct caliber normal. Drinks infrequently, last approximately 3 weeks ago. --Significantly improved on discharge and given instructions for Tylenol OTC ibuprofen OTC for pain --Etiology will continue to be cryptogenic-CBD was not dilated nonsuggestive of CBD stone --No further  vomiting  GERD --Continue PPI and given prescription of the same on discharge for follow-up  Hypertension urgency --[place] [facility] [place] that her blood pressure was cured-has noticed at home blood pressures were high and even here in the hospital it has been high so prescribed Norvasc 10 on discharge    Observations/Objective: A&O x 3  Assessment and Plan: 1. Nausea Due to recent pancreatitis - ondansetron (ZOFRAN) 8 MG tablet; Take 1 tablet (8 mg total) by mouth every 8 (eight) hours as needed for nausea or vomiting.  Dispense: 20 tablet; Refill: 0  2. Abscessed tooth - penicillin v potassium (VEETID) 500 MG tablet; Take 1 tablet (500 mg total) by mouth 3 (three) times daily.  Dispense: 30 tablet; Refill: 0 - fluconazole (DIFLUCAN) 150 MG tablet; Take 1 tablet (150 mg total) by mouth once for 1 dose.  Dispense: 1 tablet; Refill: 0-if needed for yeast infection after antibiotics  3. Hypertension, unspecified type Check BP OOO and record 2-3 times/week  - amLODipine (NORVASC) 10 MG tablet; Take 1 tablet (10 mg total) by mouth daily.  Dispense: 30 tablet; Refill: 3  4. Acute pancreatitis, unspecified complication status, unspecified pancreatitis type Resolving-avoid fatty foods, large meals, and alcohol.    5. Hospital discharge follow-up Doing much better overall    Follow Up Instructions: 3 weeks with newly assigned PCP   I discussed the assessment and treatment plan with the patient. The patient was provided an opportunity to ask questions and all were answered. The patient agreed with the plan and demonstrated  an understanding of the instructions.   The patient was advised to call back or seek an in-person evaluation if the symptoms worsen or if the condition fails to improve as anticipated.  I provided 12 minutes of non-face-to-face time during this encounter.   Freeman Caldron, PA-C

## 2019-02-06 ENCOUNTER — Other Ambulatory Visit: Payer: Self-pay

## 2019-02-06 ENCOUNTER — Ambulatory Visit: Payer: Self-pay | Attending: Family Medicine | Admitting: Physician Assistant

## 2019-02-06 DIAGNOSIS — Z09 Encounter for follow-up examination after completed treatment for conditions other than malignant neoplasm: Secondary | ICD-10-CM

## 2019-02-06 DIAGNOSIS — K047 Periapical abscess without sinus: Secondary | ICD-10-CM

## 2019-02-06 DIAGNOSIS — I10 Essential (primary) hypertension: Secondary | ICD-10-CM

## 2019-02-06 DIAGNOSIS — R110 Nausea: Secondary | ICD-10-CM

## 2019-02-06 DIAGNOSIS — K859 Acute pancreatitis without necrosis or infection, unspecified: Secondary | ICD-10-CM

## 2019-02-06 MED ORDER — ONDANSETRON HCL 8 MG PO TABS: 8.0000 mg | ORAL_TABLET | Freq: Three times a day (TID) | ORAL | 0 refills | Status: DC | PRN

## 2019-02-06 MED ORDER — PENICILLIN V POTASSIUM 500 MG PO TABS: 500.0000 mg | ORAL_TABLET | Freq: Three times a day (TID) | ORAL | 0 refills | Status: DC

## 2019-02-06 MED ORDER — FLUCONAZOLE 150 MG PO TABS: 150.0000 mg | ORAL_TABLET | Freq: Once | ORAL | 0 refills | Status: AC

## 2019-02-06 MED ORDER — AMLODIPINE BESYLATE 10 MG PO TABS: 10.0000 mg | ORAL_TABLET | Freq: Every day | ORAL | 3 refills | Status: DC

## 2019-02-06 MED FILL — FLUCONAZOLE 150 MG TABS: 150 | 1 days supply | Qty: 1 | Fill #0

## 2019-02-06 MED FILL — PENICILLIN VK 500 MG TABLET: 500 | 10 days supply | Qty: 30 | Fill #0

## 2019-02-06 MED FILL — ONDANSETRON HCL 8 MG TABLET: 8 | 6 days supply | Qty: 20 | Fill #0

## 2019-04-17 ENCOUNTER — Emergency Department (HOSPITAL_COMMUNITY): Payer: Self-pay

## 2019-04-17 ENCOUNTER — Inpatient Hospital Stay (HOSPITAL_COMMUNITY)
Admission: EM | Admit: 2019-04-17 | Discharge: 2019-04-25 | DRG: 438 | Disposition: A | Discharge status: Home / Self Care | Payer: Self-pay | Attending: Internal Medicine | Admitting: Internal Medicine

## 2019-04-17 ENCOUNTER — Encounter (HOSPITAL_COMMUNITY): Payer: Self-pay | Admitting: *Deleted

## 2019-04-17 DIAGNOSIS — I10 Essential (primary) hypertension: Secondary | ICD-10-CM | POA: Diagnosis present

## 2019-04-17 DIAGNOSIS — E8809 Other disorders of plasma-protein metabolism, not elsewhere classified: Secondary | ICD-10-CM

## 2019-04-17 DIAGNOSIS — E441 Mild protein-calorie malnutrition: Secondary | ICD-10-CM

## 2019-04-17 DIAGNOSIS — Z20828 Contact with and (suspected) exposure to other viral communicable diseases: Secondary | ICD-10-CM | POA: Diagnosis present

## 2019-04-17 DIAGNOSIS — F17210 Nicotine dependence, cigarettes, uncomplicated: Secondary | ICD-10-CM | POA: Diagnosis present

## 2019-04-17 DIAGNOSIS — Z9114 Patient's other noncompliance with medication regimen: Secondary | ICD-10-CM

## 2019-04-17 DIAGNOSIS — D509 Iron deficiency anemia, unspecified: Secondary | ICD-10-CM | POA: Diagnosis present

## 2019-04-17 DIAGNOSIS — K863 Pseudocyst of pancreas: Secondary | ICD-10-CM

## 2019-04-17 DIAGNOSIS — K861 Other chronic pancreatitis: Secondary | ICD-10-CM | POA: Diagnosis present

## 2019-04-17 DIAGNOSIS — E43 Unspecified severe protein-calorie malnutrition: Secondary | ICD-10-CM | POA: Diagnosis present

## 2019-04-17 DIAGNOSIS — D7589 Other specified diseases of blood and blood-forming organs: Secondary | ICD-10-CM

## 2019-04-17 DIAGNOSIS — R8271 Bacteriuria: Secondary | ICD-10-CM

## 2019-04-17 DIAGNOSIS — K55069 Acute infarction of intestine, part and extent unspecified: Secondary | ICD-10-CM | POA: Diagnosis present

## 2019-04-17 DIAGNOSIS — J209 Acute bronchitis, unspecified: Secondary | ICD-10-CM | POA: Diagnosis present

## 2019-04-17 DIAGNOSIS — F1210 Cannabis abuse, uncomplicated: Secondary | ICD-10-CM

## 2019-04-17 DIAGNOSIS — D539 Nutritional anemia, unspecified: Secondary | ICD-10-CM | POA: Diagnosis present

## 2019-04-17 DIAGNOSIS — E46 Unspecified protein-calorie malnutrition: Secondary | ICD-10-CM

## 2019-04-17 DIAGNOSIS — Z681 Body mass index (BMI) 19 or less, adult: Secondary | ICD-10-CM

## 2019-04-17 DIAGNOSIS — B9620 Unspecified Escherichia coli [E. coli] as the cause of diseases classified elsewhere: Secondary | ICD-10-CM | POA: Diagnosis present

## 2019-04-17 DIAGNOSIS — F1010 Alcohol abuse, uncomplicated: Secondary | ICD-10-CM | POA: Diagnosis present

## 2019-04-17 DIAGNOSIS — K859 Acute pancreatitis without necrosis or infection, unspecified: Principal | ICD-10-CM | POA: Diagnosis present

## 2019-04-17 DIAGNOSIS — K219 Gastro-esophageal reflux disease without esophagitis: Secondary | ICD-10-CM | POA: Diagnosis present

## 2019-04-17 DIAGNOSIS — Z720 Tobacco use: Secondary | ICD-10-CM

## 2019-04-17 DIAGNOSIS — N3000 Acute cystitis without hematuria: Secondary | ICD-10-CM | POA: Diagnosis present

## 2019-04-17 DIAGNOSIS — R110 Nausea: Secondary | ICD-10-CM

## 2019-04-17 DIAGNOSIS — E876 Hypokalemia: Secondary | ICD-10-CM | POA: Diagnosis present

## 2019-04-17 DIAGNOSIS — E440 Moderate protein-calorie malnutrition: Secondary | ICD-10-CM

## 2019-04-17 DIAGNOSIS — G8929 Other chronic pain: Secondary | ICD-10-CM | POA: Diagnosis present

## 2019-04-17 DIAGNOSIS — E872 Acidosis: Secondary | ICD-10-CM | POA: Diagnosis present

## 2019-04-17 DIAGNOSIS — Z79899 Other long term (current) drug therapy: Secondary | ICD-10-CM

## 2019-04-17 DIAGNOSIS — I81 Portal vein thrombosis: Secondary | ICD-10-CM

## 2019-04-17 DIAGNOSIS — Z96612 Presence of left artificial shoulder joint: Secondary | ICD-10-CM | POA: Diagnosis present

## 2019-04-17 DIAGNOSIS — K2900 Acute gastritis without bleeding: Secondary | ICD-10-CM | POA: Diagnosis present

## 2019-04-17 HISTORY — DX: Acute pancreatitis without necrosis or infection, unspecified: K85.90

## 2019-04-17 HISTORY — DX: Cannabis abuse, uncomplicated: F12.10

## 2019-04-17 HISTORY — DX: Tobacco use: Z72.0

## 2019-04-17 HISTORY — DX: Gastro-esophageal reflux disease without esophagitis: K21.9

## 2019-04-17 HISTORY — DX: Essential (primary) hypertension: I10

## 2019-04-17 LAB — RAPID URINE DRUG SCREEN, HOSP PERFORMED
Amphetamines: NOT DETECTED
Barbiturates: NOT DETECTED
Benzodiazepines: NOT DETECTED
Cocaine: NOT DETECTED
Opiates: POSITIVE — AB
Tetrahydrocannabinol: POSITIVE — AB

## 2019-04-17 LAB — CBC
HCT: 36.2 % (ref 36.0–46.0)
Hemoglobin: 12 g/dL (ref 12.0–15.0)
MCH: 35 pg — ABNORMAL HIGH (ref 26.0–34.0)
MCHC: 33.1 g/dL (ref 30.0–36.0)
MCV: 105.5 fL — ABNORMAL HIGH (ref 80.0–100.0)
Platelets: 387 10*3/uL (ref 150–400)
RBC: 3.43 MIL/uL — ABNORMAL LOW (ref 3.87–5.11)
RDW: 13.8 % (ref 11.5–15.5)
WBC: 6.4 10*3/uL (ref 4.0–10.5)
nRBC: 0 % (ref 0.0–0.2)

## 2019-04-17 LAB — COMPREHENSIVE METABOLIC PANEL
ALT: 11 U/L (ref 0–44)
AST: 24 U/L (ref 15–41)
Albumin: 3.1 g/dL — ABNORMAL LOW (ref 3.5–5.0)
Alkaline Phosphatase: 95 U/L (ref 38–126)
Anion gap: 10 (ref 5–15)
BUN: 7 mg/dL (ref 6–20)
CO2: 18 mmol/L — ABNORMAL LOW (ref 22–32)
Calcium: 8.6 mg/dL — ABNORMAL LOW (ref 8.9–10.3)
Chloride: 109 mmol/L (ref 98–111)
Creatinine, Ser: 0.87 mg/dL (ref 0.44–1.00)
GFR calc Af Amer: >60 mL/min (ref >60)
GFR calc non Af Amer: >60 mL/min (ref >60)
Glucose, Bld: 143 mg/dL — ABNORMAL HIGH (ref 70–99)
Potassium: 3.2 mmol/L — ABNORMAL LOW (ref 3.5–5.1)
Sodium: 137 mmol/L (ref 135–145)
Total Bilirubin: 0.7 mg/dL (ref 0.3–1.2)
Total Protein: 6.8 g/dL (ref 6.5–8.1)

## 2019-04-17 LAB — PREGNANCY, URINE: Preg Test, Ur: NEGATIVE

## 2019-04-17 LAB — LIPID PANEL
Cholesterol: 148 mg/dL (ref 0–200)
HDL: 39 mg/dL — ABNORMAL LOW (ref >40)
LDL Cholesterol: 92 mg/dL (ref 0–99)
Total CHOL/HDL Ratio: 3.8 RATIO
Triglycerides: 87 mg/dL (ref <150)
VLDL: 17 mg/dL (ref 0–40)

## 2019-04-17 LAB — MAGNESIUM
Magnesium: 1.7 mg/dL (ref 1.7–2.4)
Magnesium: 1.6 mg/dL — ABNORMAL LOW (ref 1.7–2.4)

## 2019-04-17 LAB — URINALYSIS, ROUTINE W REFLEX MICROSCOPIC
Bilirubin Urine: NEGATIVE
Glucose, UA: NEGATIVE mg/dL
Hgb urine dipstick: NEGATIVE
Ketones, ur: 20 mg/dL — AB
Nitrite: POSITIVE — AB
Protein, ur: NEGATIVE mg/dL
Specific Gravity, Urine: 1.014 (ref 1.005–1.030)
pH: 6 (ref 5.0–8.0)

## 2019-04-17 LAB — I-STAT BETA HCG BLOOD, ED (MC, WL, AP ONLY): I-stat hCG, quantitative: 7.4 m[IU]/mL — ABNORMAL HIGH (ref <5)

## 2019-04-17 LAB — LACTIC ACID, PLASMA
Lactic Acid, Venous: 0.7 mmol/L (ref 0.5–1.9)
Lactic Acid, Venous: 0.8 mmol/L (ref 0.5–1.9)

## 2019-04-17 LAB — HEPARIN LEVEL (UNFRACTIONATED): Heparin Unfractionated: 0.25 IU/mL — ABNORMAL LOW (ref 0.30–0.70)

## 2019-04-17 LAB — POC URINE PREG, ED: Preg Test, Ur: NEGATIVE

## 2019-04-17 LAB — LIPASE, BLOOD: Lipase: 42 U/L (ref 11–51)

## 2019-04-17 LAB — PHOSPHORUS
Phosphorus: 2.8 mg/dL (ref 2.5–4.6)
Phosphorus: 2.1 mg/dL — ABNORMAL LOW (ref 2.5–4.6)

## 2019-04-17 LAB — VITAMIN B12
Vitamin B-12: 430 pg/mL (ref 180–914)
Vitamin B-12: 449 pg/mL (ref 180–914)

## 2019-04-17 LAB — TSH: TSH: 0.869 u[IU]/mL (ref 0.350–4.500)

## 2019-04-17 MED ORDER — MORPHINE SULFATE (PF) 2 MG/ML IV SOLN
2.0000 mg | Freq: Once | INTRAVENOUS | Status: AC
  Administered 2019-04-17: 09:00:00 2 mg via INTRAVENOUS
  Filled 2019-04-17: qty 1

## 2019-04-17 MED ORDER — ONDANSETRON HCL 4 MG/2ML IJ SOLN
4.0000 mg | Freq: Once | INTRAMUSCULAR | Status: AC
  Administered 2019-04-17: 4 mg via INTRAVENOUS
  Filled 2019-04-17: qty 2

## 2019-04-17 MED ORDER — POTASSIUM CHLORIDE CRYS ER 20 MEQ PO TBCR
40.0000 meq | EXTENDED_RELEASE_TABLET | Freq: Once | ORAL | Status: AC
  Administered 2019-04-17: 40 meq via ORAL
  Filled 2019-04-17: qty 2

## 2019-04-17 MED ORDER — HEPARIN (PORCINE) 25000 UT/250ML-% IV SOLN
1050.0000 [IU]/h | INTRAVENOUS | Status: DC
  Administered 2019-04-17: 15:00:00 650 [IU]/h via INTRAVENOUS
  Administered 2019-04-18: 750 [IU]/h via INTRAVENOUS
  Administered 2019-04-20: 800 [IU]/h via INTRAVENOUS
  Administered 2019-04-21: 1050 [IU]/h via INTRAVENOUS
  Filled 2019-04-17 (×4): qty 250

## 2019-04-17 MED ORDER — FAMOTIDINE IN NACL 20-0.9 MG/50ML-% IV SOLN
20.0000 mg | Freq: Once | INTRAVENOUS | Status: AC
  Administered 2019-04-17: 10:00:00 20 mg via INTRAVENOUS
  Filled 2019-04-17: qty 50

## 2019-04-17 MED ORDER — ONDANSETRON HCL 4 MG PO TABS
4.0000 mg | ORAL_TABLET | Freq: Four times a day (QID) | ORAL | Status: DC | PRN
  Administered 2019-04-22: 4 mg via ORAL
  Filled 2019-04-17: qty 1

## 2019-04-17 MED ORDER — PIPERACILLIN-TAZOBACTAM 3.375 G IVPB 30 MIN
3.3750 g | Freq: Once | INTRAVENOUS | Status: AC
  Administered 2019-04-17: 16:00:00 3.375 g via INTRAVENOUS
  Filled 2019-04-17: qty 50

## 2019-04-17 MED ORDER — MORPHINE SULFATE (PF) 2 MG/ML IV SOLN
2.0000 mg | INTRAVENOUS | Status: DC | PRN
  Administered 2019-04-17 – 2019-04-19 (×8): 2 mg via INTRAVENOUS
  Filled 2019-04-17 (×8): qty 1

## 2019-04-17 MED ORDER — HYDRALAZINE HCL 20 MG/ML IJ SOLN
10.0000 mg | Freq: Four times a day (QID) | INTRAMUSCULAR | Status: DC | PRN
  Administered 2019-04-17: 10 mg via INTRAVENOUS
  Filled 2019-04-17: qty 1

## 2019-04-17 MED ORDER — ACETAMINOPHEN 325 MG PO TABS
650.0000 mg | ORAL_TABLET | Freq: Four times a day (QID) | ORAL | Status: DC | PRN
  Administered 2019-04-19: 650 mg via ORAL
  Filled 2019-04-17: qty 2

## 2019-04-17 MED ORDER — ACETAMINOPHEN 650 MG RE SUPP: 650.0000 mg | Freq: Four times a day (QID) | RECTAL | Status: DC | PRN

## 2019-04-17 MED ORDER — SODIUM CHLORIDE 0.9 % IV SOLN
INTRAVENOUS | Status: DC
  Administered 2019-04-17 – 2019-04-19 (×5): via INTRAVENOUS

## 2019-04-17 MED ORDER — SODIUM CHLORIDE 0.9% FLUSH: 3.0000 mL | Freq: Once | INTRAVENOUS | Status: DC

## 2019-04-17 MED ORDER — PANTOPRAZOLE SODIUM 40 MG PO TBEC
40.0000 mg | DELAYED_RELEASE_TABLET | Freq: Every day | ORAL | Status: DC
  Administered 2019-04-17 – 2019-04-19 (×3): 40 mg via ORAL
  Filled 2019-04-17 (×3): qty 1

## 2019-04-17 MED ORDER — HEPARIN BOLUS VIA INFUSION
2500.0000 [IU] | Freq: Once | INTRAVENOUS | Status: AC
  Administered 2019-04-17: 15:00:00 2500 [IU] via INTRAVENOUS
  Filled 2019-04-17: qty 2500

## 2019-04-17 MED ORDER — IOHEXOL 300 MG/ML  SOLN
100.0000 mL | Freq: Once | INTRAMUSCULAR | Status: AC | PRN
  Administered 2019-04-17: 90 mL via INTRAVENOUS

## 2019-04-17 MED ORDER — ONDANSETRON HCL 4 MG/2ML IJ SOLN
4.0000 mg | Freq: Four times a day (QID) | INTRAMUSCULAR | Status: DC | PRN
  Administered 2019-04-17 – 2019-04-25 (×24): 4 mg via INTRAVENOUS
  Filled 2019-04-17 (×24): qty 2

## 2019-04-17 MED ORDER — SODIUM CHLORIDE 0.9 % IV BOLUS
1000.0000 mL | Freq: Once | INTRAVENOUS | Status: AC
  Administered 2019-04-17: 09:00:00 1000 mL via INTRAVENOUS

## 2019-04-17 MED ORDER — NICOTINE 21 MG/24HR TD PT24
21.0000 mg | MEDICATED_PATCH | Freq: Every day | TRANSDERMAL | Status: DC
  Administered 2019-04-18: 21 mg via TRANSDERMAL
  Filled 2019-04-17: qty 1

## 2019-04-17 MED ORDER — PIPERACILLIN-TAZOBACTAM 3.375 G IVPB
3.3750 g | Freq: Three times a day (TID) | INTRAVENOUS | Status: DC
  Administered 2019-04-17 – 2019-04-18 (×2): 3.375 g via INTRAVENOUS
  Filled 2019-04-17 (×2): qty 50

## 2019-04-17 NOTE — ED Notes (Signed)
Attempted to give report and was told nurse and still getting report and will call me back

## 2019-04-17 NOTE — ED Notes (Signed)
CT made aware of negative urine preg test.

## 2019-04-17 NOTE — ED Provider Notes (Signed)
Pt seen in collaboration with Sherron Monday, PA-C.   In brief, pt presenting for evaluation of intermittent upper abdominal pain for a month, worsening severely yesterday.  She reports this persistent pain as well as tenderness over the emesis.  She has not taken anything for this.  History pancreatitis, states this feels similar.  On exam, patient appears uncomfortable due to pain, but in no acute distress.  Generalized abdominal tenderness, worse in the epigastric and left upper quadrants.  No rigidity, guarding, distention.  Plan for labs and CT for further evaluation.  Consider pancreatitis versus gastritis versus PUD.   CT shows acute on chronic pancreatitis with pseudocysts and ?pancreatic necrosis vs previous pancreatitis. Additionally, pt with SMV thrombosis. Will start heparin and call for admission. Case discussed with attending, Dr. Ralene Bathe evaluated the pt.   Discussed with Dr. Doristine Bosworth from [facility], pt to be admitted.        Franchot Heidelberg, PA-C 04/17/19 1513    Quintella Reichert, MD 04/19/19 [phone]

## 2019-04-17 NOTE — ED Triage Notes (Signed)
To ED for eval of abd pain and n/v since 5pm yesterday. States she was dx with pancreatitis a few months ago and this feels the same.

## 2019-04-17 NOTE — Progress Notes (Signed)
[place] for heparin Indication: SMV thrombosis  Allergies  Allergen Reactions  . Codeine Other (See Comments)    Cause dizziness    Patient Measurements: Height: 4\' 11"  (149.9 cm) Weight: 91 lb 11.4 oz (41.6 kg) IBW/kg (Calculated) : 43.2 Heparin Dosing Weight: 41.6kg   Vital Signs: Temp: 98.7 F (37.1 C) (09/30 1955) Temp Source: Oral (09/30 1955) BP: 148/93 (09/30 2139) Pulse Rate: 80 (09/30 2139)  Labs: Recent Labs    04/17/19 0712 04/17/19 2127  HGB 12.0  --   HCT 36.2  --   PLT 387  --   HEPARINUNFRC  --  0.25*  CREATININE 0.87  --     Estimated Creatinine Clearance: 50.8 mL/min (by C-G formula based on SCr of 0.87 mg/dL).   Assessment: 89 yof presented to the ED with abdominal pain. 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 To start IV heparin. Baseline CBC is WNL. Initial heparin level 0.25 units/ml.  No issues with infusion. No bleeding reported  Goal of Therapy:  Heparin level 0.3-0.7 units/ml Monitor platelets by anticoagulation protocol: Yes   Plan:  Increase heparin gtt 750 units/hr Daily heparin level and CBC  Sarah Burch 04/17/2019,11:15 PM

## 2019-04-17 NOTE — ED Notes (Signed)
Report given to [facility], RN on 31M

## 2019-04-17 NOTE — ED Notes (Signed)
ED TO INPATIENT HANDOFF REPORT  ED Nurse Name and Phone #: Percival Spanishlana [phone]  S Name/Age/Gender Sarah Burch 48 y.o. female Room/Bed: 014C/014C  Code Status   Code Status: Full Code  Home/SNF/Other Home Patient oriented to: self, place, time and situation Is this baseline? Yes   Triage Complete: Triage complete  Chief Complaint abd pain  Triage Note To ED for eval of abd pain and n/v since 5pm yesterday. States she was dx with pancreatitis a few months ago and this feels the same.    Allergies Allergies  Allergen Reactions  . Codeine Other (See Comments)    Cause dizziness    Level of Care/Admitting Diagnosis ED Disposition    ED Disposition Condition Comment   Admit  [facility] [[id number]]  Level of Care: Telemetry Medical [104]  I expect the patient will be discharged within 24 hours: No (not a candidate for 5C-Observation unit)  Covid Evaluation: Asymptomatic Screening Protocol (No Symptoms)  Diagnosis: Acute on chronic pancreatitis [facility](HCC) [[id number]]) [[id number]]  Admitting Physician: Ollen BowlPAHWANI, RINKA R [[id number]]  Attending Physician: Ollen BowlPAHWANI, RINKA R [phone][[id number]]  PT Class (Do Not Modify): Observation [104]  PT Acc Code (Do Not Modify): Observation [10022]       B Medical/Surgery History Past Medical History:  Diagnosis Date  . Chronic GERD   . Hypertension   . Marijuana abuse   . Pancreatitis   . Tobacco abuse    History reviewed. No pertinent surgical history.   A IV Location/Drains/Wounds Patient Lines/Drains/Airways Status   Active Line/Drains/Airways    Name:   Placement date:   Placement time:   Site:   Days:   Peripheral IV 04/17/19 Right Forearm   04/17/19    0902    Forearm   less than 1   Peripheral IV 04/17/19 Left Antecubital   04/17/19    1548    Antecubital   less than 1          Intake/Output Last 24 hours  Intake/Output Summary (Last 24 hours) at 04/17/2019 1900 Last data filed at 04/17/2019 1646 Gross per 24 hour   Intake 1300 ml  Output -  Net 1300 ml    Labs/Imaging Results for orders placed or performed during the hospital encounter of 04/17/19 (from the past 48 hour(s))  Lipase, blood     Status: None   Collection Time: 04/17/19  7:12 AM  Result Value Ref Range   Lipase 42 11 - 51 U/L    Comment: Performed at [facility], [address] [place], [place] [address]  Comprehensive metabolic panel     Status: Abnormal   Collection Time: 04/17/19  7:12 AM  Result Value Ref Range   Sodium 137 135 - 145 mmol/L   Potassium 3.2 (L) 3.5 - 5.1 mmol/L   Chloride 109 98 - 111 mmol/L   CO2 18 (L) 22 - 32 mmol/L   Glucose, Bld 143 (H) 70 - 99 mg/dL   BUN 7 6 - 20 mg/dL   Creatinine, Ser 4.130.87 0.44 - 1.00 mg/dL   Calcium 8.6 (L) 8.9 - 10.3 mg/dL   Total Protein 6.8 6.5 - 8.1 g/dL   Albumin 3.1 (L) 3.5 - 5.0 g/dL   AST 24 15 - 41 U/L   ALT 11 0 - 44 U/L   Alkaline Phosphatase 95 38 - 126 U/L   Total Bilirubin 0.7 0.3 - 1.2 mg/dL   GFR calc non Af Amer >60 >60 mL/min   GFR  calc Af Amer >60 >60 mL/min   Anion gap 10 5 - 15    Comment: Performed at [facility], [address] [place], [place] [address]  CBC     Status: Abnormal   Collection Time: 04/17/19  7:12 AM  Result Value Ref Range   WBC 6.4 4.0 - 10.5 K/uL   RBC 3.43 (L) 3.87 - 5.11 MIL/uL   Hemoglobin 12.0 12.0 - 15.0 g/dL   HCT 14.4 81.8 - 56.3 %   MCV 105.5 (H) 80.0 - 100.0 fL   MCH 35.0 (H) 26.0 - 34.0 pg   MCHC 33.1 30.0 - 36.0 g/dL   RDW 14.9 70.2 - 63.7 %   Platelets 387 150 - 400 K/uL   nRBC 0.0 0.0 - 0.2 %    Comment: Performed at [facility], [address], [place], [place] [address]  I-Stat beta hCG blood, ED     Status: Abnormal   Collection Time: 04/17/19 10:37 AM  Result Value Ref Range   I-stat hCG, quantitative 7.4 (H) <5 mIU/mL   Comment 3            Comment:   GEST. AGE      CONC.  (mIU/mL)   <=1 WEEK        5 - 50     2 WEEKS       50 - 500     3 WEEKS       100 - 10,000     4 WEEKS      1,000 - 30,000        FEMALE AND NON-PREGNANT FEMALE:     LESS THAN 5 mIU/mL   Urinalysis, Routine w reflex microscopic     Status: Abnormal   Collection Time: 04/17/19 11:23 AM  Result Value Ref Range   Color, Urine YELLOW YELLOW   APPearance HAZY (A) CLEAR   Specific Gravity, Urine 1.014 1.005 - 1.030   pH 6.0 5.0 - 8.0   Glucose, UA NEGATIVE NEGATIVE mg/dL   Hgb urine dipstick NEGATIVE NEGATIVE   Bilirubin Urine NEGATIVE NEGATIVE   Ketones, ur 20 (A) NEGATIVE mg/dL   Protein, ur NEGATIVE NEGATIVE mg/dL   Nitrite POSITIVE (A) NEGATIVE   Leukocytes,Ua TRACE (A) NEGATIVE   RBC / HPF 6-10 0 - 5 RBC/hpf   WBC, UA 0-5 0 - 5 WBC/hpf   Bacteria, UA FEW (A) NONE SEEN   Squamous Epithelial / LPF 11-20 0 - 5   Mucus PRESENT    Hyaline Casts, UA PRESENT     Comment: Performed at [facility], [address] [place], [place] [address]  Urine rapid drug screen (hosp performed)     Status: Abnormal   Collection Time: 04/17/19 11:23 AM  Result Value Ref Range   Opiates POSITIVE (A) NONE DETECTED   Cocaine NONE DETECTED NONE DETECTED   Benzodiazepines NONE DETECTED NONE DETECTED   Amphetamines NONE DETECTED NONE DETECTED   Tetrahydrocannabinol POSITIVE (A) NONE DETECTED   Barbiturates NONE DETECTED NONE DETECTED    Comment: (NOTE) DRUG SCREEN FOR MEDICAL PURPOSES ONLY.  IF CONFIRMATION IS NEEDED FOR ANY PURPOSE, NOTIFY LAB WITHIN 5 DAYS. LOWEST DETECTABLE LIMITS FOR URINE DRUG SCREEN Drug Class                     Cutoff (ng/mL) Amphetamine and metabolites    1000 Barbiturate and metabolites    200 Benzodiazepine  200 Tricyclics and metabolites     300 Opiates and metabolites        300 Cocaine and metabolites        300 THC                            50 Performed at [facility], [address], [place], [place] [address]   Pregnancy, urine     Status: None   Collection Time: 04/17/19 11:23 AM  Result Value Ref Range   Preg Test, Ur NEGATIVE  NEGATIVE    Comment:        THE SENSITIVITY OF THIS METHODOLOGY IS >20 mIU/mL. Performed at [facility], [address], [place], [place] [address]   POC Urine Pregnancy, ED (not at [facility])     Status: None   Collection Time: 04/17/19 11:43 AM  Result Value Ref Range   Preg Test, Ur NEGATIVE NEGATIVE    Comment:        THE SENSITIVITY OF THIS METHODOLOGY IS >24 mIU/mL   Lipid panel     Status: Abnormal   Collection Time: 04/17/19  2:30 PM  Result Value Ref Range   Cholesterol 148 0 - 200 mg/dL   Triglycerides 87 <086 mg/dL   HDL 39 (L) >57 mg/dL   Total CHOL/HDL Ratio 3.8 RATIO   VLDL 17 0 - 40 mg/dL   LDL Cholesterol 92 0 - 99 mg/dL    Comment:        Total Cholesterol/HDL:CHD Risk Coronary Heart Disease Risk Table                     Men   Women  1/2 Average Risk   3.4   3.3  Average Risk       5.0   4.4  2 X Average Risk   9.6   7.1  3 X Average Risk  23.4   11.0        Use the calculated Patient Ratio above and the CHD Risk Table to determine the patient's CHD Risk.        ATP III CLASSIFICATION (LDL):  <100     mg/dL   Optimal  846-962  mg/dL   Near or Above                    Optimal  130-159  mg/dL   Borderline  952-841  mg/dL   High  >324     mg/dL   Very High Performed at [facility], [address], [place], [place] [address]   Lactic acid, plasma     Status: None   Collection Time: 04/17/19  2:35 PM  Result Value Ref Range   Lactic Acid, Venous 0.7 0.5 - 1.9 mmol/L    Comment: Performed at [facility], [address] [place], [place] [address]  Vitamin B12     Status: None   Collection Time: 04/17/19  3:30 PM  Result Value Ref Range   Vitamin B-12 430 180 - 914 pg/mL    Comment: (NOTE) This assay is not validated for testing neonatal or myeloproliferative syndrome specimens for Vitamin B12 levels. Performed at [facility], [address] [place], [place] [address]   Magnesium     Status: None   Collection Time:  04/17/19  3:30 PM  Result Value Ref Range   Magnesium 1.7 1.7 -  2.4 mg/dL    Comment: Performed at [facility], [address] [place], [place] [address]  Phosphorus     Status: None   Collection Time: 04/17/19  3:30 PM  Result Value Ref Range   Phosphorus 2.8 2.5 - 4.6 mg/dL    Comment: Performed at [facility], [address] [place], [place] [address]  Lactic acid, plasma     Status: None   Collection Time: 04/17/19  3:55 PM  Result Value Ref Range   Lactic Acid, Venous 0.8 0.5 - 1.9 mmol/L    Comment: Performed at [facility], [address] [place], [place] [address]   Ct Abdomen Pelvis W Contrast  Result Date: 04/17/2019 CLINICAL DATA:  Acute generalized upper abdominal pain for 1 month with nausea and vomiting since yesterday afternoon, history of pancreatitis EXAM: CT ABDOMEN AND PELVIS WITH CONTRAST TECHNIQUE: Multidetector CT imaging of the abdomen and pelvis was performed using the standard protocol following bolus administration of intravenous contrast. Sagittal and coronal MPR images reconstructed from axial data set. CONTRAST:  90mL OMNIPAQUE IOHEXOL 300 MG/ML SOLN IV. No oral contrast. COMPARISON:  None FINDINGS: Lower chest: Lung bases clear Hepatobiliary: Gallbladder and liver normal appearance Pancreas: Diffusely edematous pancreas compatible with acute pancreatitis. Lack of enhancement of an atrophic pancreatic body segment, question pancreatic necrosis though this could be acute or old. Scattered calcifications at pancreatic tail, few calcifications at head and uncinate process. Small fluid collection at uncinate process consistent with pseudo cyst, 18 x 14 mm image 30. Additional small less well-defined fluid collection at head/body 8 x 6 mm. Edema of duodenal wall. Infiltrative changes extend to the LEFT medial margin of the gallbladder, surrounding duodenum, and extend into lesser sac. Spleen: Normal appearance.  Small adjacent splenule.  Adrenals/Urinary Tract: Adrenal glands normal appearance. Tiny nonobstructing LEFT renal calculi. Kidneys, ureters, and bladder otherwise unremarkable. Stomach/Bowel: Normal appendix. Mild edema adjacent multiple small bowel loops without definite wall thickening. Minimal wall thickening of gastric antrum. Stomach otherwise decompressed. Vascular/Lymphatic: Atherosclerotic calcifications aorta and iliac arteries without aneurysm. Splenic and portal vein patent. 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 Reproductive: Unremarkable uterus and RIGHT ovary. Dominant follicle LEFT ovary 2.2 cm diameter. Small amount of air in vagina. Other: Small amount of free fluid in pelvis. No free air. No hernia. Musculoskeletal: Degenerative changes of the RIGHT hip joint. Grade 1 anterolisthesis L4-L5 secondary to facet degenerative changes. IMPRESSION: Changes of acute on chronic calcific pancreatitis with significant peripancreatic edema. Two small fluid collections are seen, 18 x 14 mm at uncinate process and 8 x 6 mm at head/body junction, consistent with pseudo cysts. Atrophic segment of pancreatic body with absent enhancement, concerning for pancreatic necrosis though this may potentially be the sequela of prior pancreatitis rather than acute necrosis due to the relative atrophy. 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 Findings called to Dr. Fredirick Maudlin on 04/17/2019 at 1336 hrs. Electronically Signed   By: Ulyses Southward M.D.   On: 04/17/2019 13:38    Pending Labs Unresulted Labs (From admission, onward)    Start     Ordered   04/18/19 0500  Heparin level (unfractionated)  Daily,   R     04/17/19 1350   04/18/19 0500  CBC  Daily,   R     04/17/19 1350   04/18/19 0500  Basic metabolic panel  Tomorrow morning,   R     04/17/19 1431   04/17/19 2030  Heparin level (unfractionated)  Once-Timed,   STAT     04/17/19 1350   04/17/19 1600  ANA  Once,   R     04/17/19 1600    04/17/19 1500  TSH  Add-on,   AD     04/17/19 1459   04/17/19 1448  Vitamin B12  Add-on,   AD     04/17/19 1447   04/17/19 1448  Folate RBC  Add-on,   AD     04/17/19 1447   04/17/19 1439  Protein S activity  Once,   STAT     04/17/19 1439   04/17/19 1439  Prothrombin gene mutation  Once,   STAT     04/17/19 1439   04/17/19 1439  Factor 8 assay  Once,   STAT     04/17/19 1439   04/17/19 1438  Protein C activity  Once,   STAT     04/17/19 1439   04/17/19 1437  Factor 5 leiden  Add-on,   AD     04/17/19 1439   04/17/19 1436  ANA  Add-on,   AD     04/17/19 1439   04/17/19 1436  Antiphospholipid syndrome eval, bld  Add-on,   AD     04/17/19 1439   04/17/19 1431  Magnesium  Once,   STAT     04/17/19 1431   04/17/19 1431  Phosphorus  Once,   STAT     04/17/19 1431   04/17/19 1345  SARS CORONAVIRUS 2 (TAT 6-24 HRS) Nasopharyngeal Nasopharyngeal Swab  (Asymptomatic/Tier 2 Patients Labs)  Once,   STAT    Question Answer Comment  Is this test for diagnosis or screening Screening   Symptomatic for COVID-19 as defined by CDC No   Hospitalized for COVID-19 No   Admitted to ICU for COVID-19 No   Previously tested for COVID-19 Yes   Resident in a congregate (group) care setting No   Employed in healthcare setting No   Pregnant No      04/17/19 1344   04/17/19 1315  Urine culture  Add-on,   AD     04/17/19 1315          Vitals/Pain Today's Vitals   04/17/19 1707 04/17/19 1715 04/17/19 1730 04/17/19 1747  BP: (!) 174/98   (!) 173/97  Pulse: (!) 31 70 80 73  Resp: (!) 30 15 (!) 23 19  Temp:      TempSrc:      SpO2: (!) 77% 100% 97% 100%  Weight:      Height:      PainSc:        Isolation Precautions No active isolations  Medications Medications  sodium chloride flush (NS) 0.9 % injection 3 mL (has no administration in time range)  heparin ADULT infusion 100 units/mL (25000 units/260mL sodium chloride 0.45%) (650 Units/hr Intravenous New Bag/Given 04/17/19 1439)  0.9 %   sodium chloride infusion ( Intravenous New Bag/Given 04/17/19 1556)  ondansetron (ZOFRAN) tablet 4 mg (has no administration in time range)    Or  ondansetron (ZOFRAN) injection 4 mg (has no administration in time range)  acetaminophen (TYLENOL) tablet 650 mg (has no administration in time range)    Or  acetaminophen (TYLENOL) suppository 650 mg (has no administration in time range)  nicotine (NICODERM CQ - dosed in mg/24 hours) patch 21 mg (has no administration in time range)  hydrALAZINE (APRESOLINE) injection 10 mg (has no administration in time range)  pantoprazole (PROTONIX) EC tablet 40  mg (40 mg Oral Given 04/17/19 1539)  morphine 2 MG/ML injection 2 mg (2 mg Intravenous Given 04/17/19 1502)  piperacillin-tazobactam (ZOSYN) IVPB 3.375 g (has no administration in time range)  ondansetron (ZOFRAN) injection 4 mg (4 mg Intravenous Given 04/17/19 0903)  morphine 2 MG/ML injection 2 mg (2 mg Intravenous Given 04/17/19 0903)  sodium chloride 0.9 % bolus 1,000 mL (0 mLs Intravenous Stopped 04/17/19 1157)  famotidine (PEPCID) IVPB 20 mg premix (0 mg Intravenous Stopped 04/17/19 1047)  potassium chloride SA (KLOR-CON) CR tablet 40 mEq (40 mEq Oral Given 04/17/19 0927)  iohexol (OMNIPAQUE) 300 MG/ML solution 100 mL (90 mLs Intravenous Contrast Given 04/17/19 1314)  heparin bolus via infusion 2,500 Units (2,500 Units Intravenous Bolus from Bag 04/17/19 1439)  ondansetron (ZOFRAN) injection 4 mg (4 mg Intravenous Given 04/17/19 1433)  piperacillin-tazobactam (ZOSYN) IVPB 3.375 g (0 g Intravenous Stopped 04/17/19 1646)    Mobility walks Low fall risk   Focused Assessments Cardiac Assessment Handoff:    No results found for: CKTOTAL, CKMB, CKMBINDEX, TROPONINI No results found for: DDIMER Does the Patient currently have chest pain? No      R Recommendations: See Admitting Provider Note  Report given to:   Additional Notes:

## 2019-04-17 NOTE — ED Notes (Signed)
Pt is NSR on monitor 

## 2019-04-17 NOTE — ED Notes (Signed)
Patient transported to CT 

## 2019-04-17 NOTE — ED Notes (Signed)
Heparin verified with Thurmond Butts, RN

## 2019-04-17 NOTE — Progress Notes (Signed)
ANTICOAGULATION CONSULT NOTE - Initial Consult  Pharmacy Consult for heparin Indication: SMV thrombosis  Allergies  Allergen Reactions  . Codeine Other (See Comments)    Cause dizziness    Patient Measurements: Height: 4\' 11"  (149.9 cm) Weight: 91 lb 11.4 oz (41.6 kg) IBW/kg (Calculated) : 43.2 Heparin Dosing Weight: 41.6kg   Vital Signs: Temp: 97.8 F (36.6 C) (09/30 0744) Temp Source: Oral (09/30 0744) BP: 181/124 (09/30 1331) Pulse Rate: 80 (09/30 1331)  Labs: Recent Labs    04/17/19 0712  HGB 12.0  HCT 36.2  PLT 387  CREATININE 0.87    Estimated Creatinine Clearance: 50.8 mL/min (by C-G formula based on SCr of 0.87 mg/dL).   Medical History: History reviewed. No pertinent past medical history.  Medications:  Infusions:  . heparin      Assessment: 31 yof presented to the ED with abdominal pain. 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 To start IV heparin. Baseline CBC is WNL.  Goal of Therapy:  Heparin level 0.3-0.7 units/ml Monitor platelets by anticoagulation protocol: Yes   Plan:  Heparin bolus 2500 units IV x 1 Heparin gtt 650 units/hr Check a 6 hr heparin level Daily heparin level and CBC  Jolonda Gomm, Rande Lawman 04/17/2019,1:50 PM

## 2019-04-17 NOTE — ED Provider Notes (Signed)
[facility] EMERGENCY DEPARTMENT Provider Note   CSN: [id number] Arrival date & time: 04/17/19  9509     History   Chief Complaint Chief Complaint  Patient presents with   Abdominal Pain    HPI Sarah Burch is a 48 y.o. female history of GERD, HTN, smoker with 1 month of intermittent abdominal pain that became constant for the past 4 days. Pain is epigastric, constant, sharp, worse with laying supine and associated with nausea vomiting X 10 nonbloody nonbilious. Pain is 8/10 and radiates to her back and is worse with eating and laying supine.    Patient states she was hospitalized for pancreatitis and discharged 7/16.  States this feels like the same thing.  Patient had a right upper quadrant ultrasound that was negative for gallstones and elevated lipase prior ED visit.  Patient states that she has hypertension but has been out of blood pressure meds since Saturday.    Patient states that her husband lost his job recently and that she is dealing with income at this time.  They are currently living with on the [address].   Patient states she has been drinking less frequently recently over the past month she has had 4x12 ounce beers total patient states that she drank heavily until she was age 65.  Patient is a lifelong smoker but has decreased smoking recently.  Currently endorses 3 cigarettes a day for the past 2 months.      HPI  History reviewed. No pertinent past medical history.  Patient Active Problem List   Diagnosis Date Noted   Acute pancreatitis 01/26/2019   GERD (gastroesophageal reflux disease) 01/26/2019   Pancreatitis 01/26/2019    History reviewed. No pertinent surgical history.   OB History   No obstetric history on file.      Home Medications    Prior to Admission medications   Medication Sig Start Date End Date Taking? Authorizing Provider  acetaminophen (TYLENOL) 325 MG tablet Take 650 mg by mouth every 6 (six) hours as needed  for headache (pain).    [provider]  amLODipine (NORVASC) 10 MG tablet Take 1 tablet (10 mg total) by mouth daily. 02/06/19 06/06/19  Argentina Donovan, PA-C  omeprazole (PRILOSEC) 20 MG capsule Take 1 capsule (20 mg total) by mouth daily. 01/31/19   Nita Sells, MD  ondansetron (ZOFRAN) 8 MG tablet Take 1 tablet (8 mg total) by mouth every 8 (eight) hours as needed for nausea or vomiting. 02/06/19   Argentina Donovan, PA-C  penicillin v potassium (VEETID) 500 MG tablet Take 1 tablet (500 mg total) by mouth 3 (three) times daily. 02/06/19   Argentina Donovan, PA-C    Family History No family history on file.  Social History Social History   Tobacco Use   Smoking status: Current Every Day Smoker   Smokeless tobacco: Never Used  Substance Use Topics   Alcohol use: Yes   Drug use: Never     Allergies   Codeine   Review of Systems Review of Systems  Constitutional: Negative for chills and fever.  HENT: Negative for congestion and ear pain.   Eyes: Negative for pain.  Respiratory: Negative for cough and shortness of breath.   Cardiovascular: Negative for chest pain.  Gastrointestinal: Positive for abdominal pain, constipation, nausea and vomiting. Negative for blood in stool and diarrhea.  Endocrine: Negative for polydipsia and polyuria.  Genitourinary: Negative for difficulty urinating, dysuria and hematuria.  Musculoskeletal: Negative for myalgias.  Neurological: Negative for dizziness and headaches.     Physical Exam Updated Vital Signs BP (!) 181/124    Pulse 80    Temp 97.8 F (36.6 C) (Oral)    Resp 14    SpO2 99%   Physical Exam Vitals signs and nursing note reviewed.  Constitutional:      General: She is in acute distress.     Appearance: She is not ill-appearing or diaphoretic.  HENT:     Head: Normocephalic and atraumatic.  Eyes:     General: No scleral icterus. Neck:     Musculoskeletal: Normal range of motion. No neck rigidity.    Cardiovascular:     Rate and Rhythm: Normal rate and regular rhythm.     Pulses: Normal pulses.     Heart sounds: Normal heart sounds.  Pulmonary:     Effort: Pulmonary effort is normal.     Breath sounds: Normal breath sounds.  Abdominal:     General: Abdomen is flat. Bowel sounds are normal. There is no distension.     Palpations: Abdomen is soft.     Tenderness: There is abdominal tenderness. There is no right CVA tenderness, left CVA tenderness, guarding or rebound.     Comments: tenderness in the epigastric and right and left upper quadrants.   Musculoskeletal:     Right lower leg: No edema.     Left lower leg: No edema.  Skin:    General: Skin is warm and dry.     Capillary Refill: Capillary refill takes less than 2 seconds.  Neurological:     Mental Status: She is alert. Mental status is at baseline.  Psychiatric:        Behavior: Behavior normal.      ED Treatments / Results  Labs (all labs ordered are listed, but only abnormal results are displayed) Labs Reviewed  COMPREHENSIVE METABOLIC PANEL - Abnormal; Notable for the following components:      Result Value   Potassium 3.2 (*)    CO2 18 (*)    Glucose, Bld 143 (*)    Calcium 8.6 (*)    Albumin 3.1 (*)    All other components within normal limits  CBC - Abnormal; Notable for the following components:   RBC 3.43 (*)    MCV 105.5 (*)    MCH 35.0 (*)    All other components within normal limits  URINALYSIS, ROUTINE W REFLEX MICROSCOPIC - Abnormal; Notable for the following components:   APPearance HAZY (*)    Ketones, ur 20 (*)    Nitrite POSITIVE (*)    Leukocytes,Ua TRACE (*)    Bacteria, UA FEW (*)    All other components within normal limits  RAPID URINE DRUG SCREEN, HOSP PERFORMED - Abnormal; Notable for the following components:   Opiates POSITIVE (*)    Tetrahydrocannabinol POSITIVE (*)    All other components within normal limits  I-STAT BETA HCG BLOOD, ED ([facility], [facility], [facility] ONLY) - Abnormal; Notable  for the following components:   I-stat hCG, quantitative 7.4 (*)    All other components within normal limits  URINE CULTURE  SARS CORONAVIRUS 2 (TAT 6-24 HRS)  LIPASE, BLOOD  PREGNANCY, URINE  LACTIC ACID, PLASMA  LACTIC ACID, PLASMA  POC URINE PREG, ED    EKG None  Radiology Ct Abdomen Pelvis W Contrast  Result Date: 04/17/2019 CLINICAL DATA:  Acute generalized upper abdominal pain for 1 month with nausea and vomiting since yesterday afternoon, history of pancreatitis EXAM:  CT ABDOMEN AND PELVIS WITH CONTRAST TECHNIQUE: Multidetector CT imaging of the abdomen and pelvis was performed using the standard protocol following bolus administration of intravenous contrast. Sagittal and coronal MPR images reconstructed from axial data set. CONTRAST:  90mL OMNIPAQUE IOHEXOL 300 MG/ML SOLN IV. No oral contrast. COMPARISON:  None FINDINGS: Lower chest: Lung bases clear Hepatobiliary: Gallbladder and liver normal appearance Pancreas: Diffusely edematous pancreas compatible with acute pancreatitis. Lack of enhancement of an atrophic pancreatic body segment, question pancreatic necrosis though this could be acute or old. Scattered calcifications at pancreatic tail, few calcifications at head and uncinate process. Small fluid collection at uncinate process consistent with pseudo cyst, 18 x 14 mm image 30. Additional small less well-defined fluid collection at head/body 8 x 6 mm. Edema of duodenal wall. Infiltrative changes extend to the LEFT medial margin of the gallbladder, surrounding duodenum, and extend into lesser sac. Spleen: Normal appearance.  Small adjacent splenule. Adrenals/Urinary Tract: Adrenal glands normal appearance. Tiny nonobstructing LEFT renal calculi. Kidneys, ureters, and bladder otherwise unremarkable. Stomach/Bowel: Normal appendix. Mild edema adjacent multiple small bowel loops without definite wall thickening. Minimal wall thickening of gastric antrum. Stomach otherwise decompressed.  Vascular/Lymphatic: Atherosclerotic calcifications aorta and iliac arteries without aneurysm. Splenic and portal vein patent. 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 Reproductive: Unremarkable uterus and RIGHT ovary. Dominant follicle LEFT ovary 2.2 cm diameter. Small amount of air in vagina. Other: Small amount of free fluid in pelvis. No free air. No hernia. Musculoskeletal: Degenerative changes of the RIGHT hip joint. Grade 1 anterolisthesis L4-L5 secondary to facet degenerative changes. IMPRESSION: Changes of acute on chronic calcific pancreatitis with significant peripancreatic edema. Two small fluid collections are seen, 18 x 14 mm at uncinate process and 8 x 6 mm at head/body junction, consistent with pseudo cysts. Atrophic segment of pancreatic body with absent enhancement, concerning for pancreatic necrosis though this may potentially be the sequela of prior pancreatitis rather than acute necrosis due to the relative atrophy. 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 Findings called to Dr. Fredirick Maudlin on 04/17/2019 at 1336 hrs. Electronically Signed   By: Ulyses Southward M.D.   On: 04/17/2019 13:38    Procedures Procedures (including critical care time)  Medications Ordered in ED Medications  sodium chloride flush (NS) 0.9 % injection 3 mL (has no administration in time range)  ondansetron (ZOFRAN) injection 4 mg (4 mg Intravenous Given 04/17/19 0903)  morphine 2 MG/ML injection 2 mg (2 mg Intravenous Given 04/17/19 0903)  sodium chloride 0.9 % bolus 1,000 mL (0 mLs Intravenous Stopped 04/17/19 1157)  famotidine (PEPCID) IVPB 20 mg premix (0 mg Intravenous Stopped 04/17/19 1047)  potassium chloride SA (KLOR-CON) CR tablet 40 mEq (40 mEq Oral Given 04/17/19 0927)  iohexol (OMNIPAQUE) 300 MG/ML solution 100 mL (90 mLs Intravenous Contrast Given 04/17/19 1314)     Initial Impression / Assessment and Plan / ED Course  I have reviewed the triage vital signs  and the nursing notes.  Pertinent labs & imaging results that were available during my care of the patient were reviewed by me and considered in my medical decision making (see chart for details).      Patient is 48 year old female with hypertension, GERD, pancreatitis presenting for 4 days of constant epigastric pain that radiates to her back.   Doubt GB etiology due to normal WBC, pain is more localized in the epigastric and left upper quadrant areas, right upper quadrant ultrasound conducted 7/10 showed sludging but no gallstones.  Concerning for PUD, GERD patient location and description.  Lipase is within normal limits.  Will obtain CT scan to assess pancreas and for other abdominal pathology given pain and persistent vomiting.  Patient has mildly low potassium 3.3 is likely due to numerous episodes of vomiting, given potassium p.o. and 1 L IV fluids.  Given Zofran for nausea and morphine for pain control.  Given Pepcid 20 mg as well due to concern for ulcer disease/GERD as origin of pain.   Patient is lipase was elevated to 200s during hospitalization for pancreatitis.  With normal lipase today, considering hyperemesis, patient has evidence of UTI on UA however doubt this is cause of upper abdominal and epigastric pain will obtain urine culture. Urine drug screen positive for THC.   CT results show pancreatitis with pancreatic pseudocysts and superior mesenteric vein thrombosis. Consult pharm for heparin dosing. Admit to medicine.    Final Clinical Impressions(s) / ED Diagnoses   Final diagnoses:  Acute cystitis without hematuria  Acute on chronic pancreatitis [facility])  Mesenteric vein thrombosis [facility])    ED Discharge Orders    None       Gailen Shelter, [place] 04/17/19 1451    Tilden Fossa, MD 04/23/19 [phone]

## 2019-04-17 NOTE — H&P (Signed)
History and Physical    [name] IEP:[id number] DOB: 11/02/1970 DOA: 04/17/2019  PCP: Patient, No Pcp Per  Patient coming from: Home  I have personally briefly reviewed patient's old medical records in [facility] Link  Chief Complaint: Epigastric pain and nausea  HPI: Sarah Burch is a 48 y.o. female with medical history significant of chronic pancreatitis, GERD, hypertension, tobacco abuse, marijuana abuse presents to emergency department due to worsening epigastric pain and nausea since yesterday.  Patient reports that her symptoms started last month however since yesterday it got worse.  She is unable to keep anything down.  Reports 10 out of 10 epigastric pain, radiates to her back, no aggravating factors, relieved with ibuprofen, associated with nausea.  Denies fever, chills, vomiting, hematemesis, melena, cough, congestion, headache, blurry vision, chest pain, shortness of breath, palpitation or leg swelling.  She reports decreased appetite and weight loss of 10 pounds in last 4 months.  Denies night sweats.  She lives with her husband.  Smokes 4 cigarettes/day, drinks beer occasionally, uses marijuana occasionally.  She takes amlodipine for her blood pressure.  Reports that she ran out of her medicine since couple of days.  She denies any urinary symptoms such as dysuria, hematuria, foul-smelling urine, back pain, change in urinary frequency.  She is not sexually active.  Patient denies history of DVT/PE or family history of hypercoagulable issues.  ED Course: Patient blood pressure elevated, UDS is positive for opioids and cannabis, UA is positive for nitrites and leukocytes.  Lipase: WNL.  No leukocytosis, CBC shows macrocytosis, CMP shows hypokalemia and hypoalbuminemia.  CT abdomen/pelvis showed acute on chronic pancreatitis, 2 small fluid collections consistent with pseudocyst, atrophic segment of pancreatic body with absent enhancement, concerning for pancreatic necrosis though  this may potentially be sequelae of prior pancreatitis rather than acute necrosis due to relative atrophy.  Small SMV thrombus-patient started on full dose heparin.  Morphine given in ED for pain control.  Review of Systems: As per HPI otherwise negative.    Past Medical History:  Diagnosis Date  . Chronic GERD   . Hypertension   . Marijuana abuse   . Pancreatitis   . Tobacco abuse     History reviewed. No pertinent surgical history.   reports that she has been smoking. She has never used smokeless tobacco. She reports current alcohol use. She reports that she does not use drugs.  Allergies  Allergen Reactions  . Codeine Other (See Comments)    Cause dizziness    History reviewed. No pertinent family history.  Prior to Admission medications   Medication Sig Start Date End Date Taking? Authorizing Provider  acetaminophen (TYLENOL) 325 MG tablet Take 650 mg by mouth every 6 (six) hours as needed for headache (pain).   Yes [provider]  amLODipine (NORVASC) 10 MG tablet Take 1 tablet (10 mg total) by mouth daily. 02/06/19 06/06/19 Yes McClung, Marzella Schlein, PA-C  omeprazole (PRILOSEC) 20 MG capsule Take 1 capsule (20 mg total) by mouth daily. 01/31/19  Yes Rhetta Mura, MD  ondansetron (ZOFRAN) 8 MG tablet Take 1 tablet (8 mg total) by mouth every 8 (eight) hours as needed for nausea or vomiting. Patient not taking: Reported on 04/17/2019 02/06/19   Anders Simmonds, [place]    Physical Exam: Vitals:   04/17/19 1230 04/17/19 1331 04/17/19 1400 04/17/19 1430  BP: (!) 175/101 (!) 181/124 (!) 182/106 (!) 173/110  Pulse: 77 80 75 80  Resp: 20 14 (!) 24 17  Temp:      TempSrc:      SpO2: 98% 99% 99% 98%  Weight:  41.6 kg    Height:  4\' 11"  (1.499 m)      Constitutional: NAD, calm, comfortable Vitals:   04/17/19 1230 04/17/19 1331 04/17/19 1400 04/17/19 1430  BP: (!) 175/101 (!) 181/124 (!) 182/106 (!) 173/110  Pulse: 77 80 75 80  Resp: 20 14 (!) 24 17  Temp:       TempSrc:      SpO2: 98% 99% 99% 98%  Weight:  41.6 kg    Height:  4\' 11"  (1.499 m)     Constitutional: Alert and oriented x3, not in acute distress, communicating well.  Appears thin and lean Eyes: PERRL, lids and conjunctivae normal ENMT: Mucous membranes are dry t. Posterior pharynx clear of any exudate or lesions.Normal dentition.  Neck: normal, supple, no masses, no thyromegaly Respiratory: clear to auscultation bilaterally, no wheezing, no crackles. Normal respiratory effort. No accessory muscle use.  Cardiovascular: Regular rate and rhythm, no murmurs / rubs / gallops. No extremity edema. 2+ pedal pulses. No carotid bruits.  Abdomen: Epigastric tenderness positive, no guarding, no rigidity.  No masses palpated. No hepatosplenomegaly. Bowel sounds positive.  Musculoskeletal: no clubbing / cyanosis. No joint deformity upper and lower extremities. Good ROM, no contractures. Normal muscle tone.  Skin: no rashes, lesions, ulcers. No induration Neurologic: CN 2-12 grossly intact. Sensation intact, DTR normal. Strength 5/5 in all 4.  Psychiatric: Normal judgment and insight. Alert and oriented x 3. Normal mood.    Labs on Admission: I have personally reviewed following labs and imaging studies  CBC: Recent Labs  Lab 04/17/19 0712  WBC 6.4  HGB 12.0  HCT 36.2  MCV 105.5*  PLT 387   Basic Metabolic Panel: Recent Labs  Lab 04/17/19 0712  NA 137  K 3.2*  CL 109  CO2 18*  GLUCOSE 143*  BUN 7  CREATININE 0.87  CALCIUM 8.6*   GFR: Estimated Creatinine Clearance: 50.8 mL/min (by C-G formula based on SCr of 0.87 mg/dL). Liver Function Tests: Recent Labs  Lab 04/17/19 0712  AST 24  ALT 11  ALKPHOS 95  BILITOT 0.7  PROT 6.8  ALBUMIN 3.1*   Recent Labs  Lab 04/17/19 0712  LIPASE 42   No results for input(s): AMMONIA in the last 168 hours. Coagulation Profile: No results for input(s): INR, PROTIME in the last 168 hours. Cardiac Enzymes: No results for input(s):  CKTOTAL, CKMB, CKMBINDEX, TROPONINI in the last 168 hours. BNP (last 3 results) No results for input(s): PROBNP in the last 8760 hours. HbA1C: No results for input(s): HGBA1C in the last 72 hours. CBG: No results for input(s): GLUCAP in the last 168 hours. Lipid Profile: Recent Labs    04/17/19 1430  CHOL 148  HDL 39*  LDLCALC 92  TRIG 87  CHOLHDL 3.8   Thyroid Function Tests: No results for input(s): TSH, T4TOTAL, FREET4, T3FREE, THYROIDAB in the last 72 hours. Anemia Panel: No results for input(s): VITAMINB12, FOLATE, FERRITIN, TIBC, IRON, RETICCTPCT in the last 72 hours. Urine analysis:    Component Value Date/Time   COLORURINE YELLOW 04/17/2019 1123   APPEARANCEUR HAZY (A) 04/17/2019 1123   LABSPEC 1.014 04/17/2019 1123   PHURINE 6.0 04/17/2019 1123   GLUCOSEU NEGATIVE 04/17/2019 1123   HGBUR NEGATIVE 04/17/2019 1123   BILIRUBINUR NEGATIVE 04/17/2019 1123   KETONESUR 20 (A) 04/17/2019 1123   PROTEINUR NEGATIVE 04/17/2019 1123   NITRITE POSITIVE (A)  04/17/2019 1123   LEUKOCYTESUR TRACE (A) 04/17/2019 1123    Radiological Exams on Admission: Ct Abdomen Pelvis W Contrast  Result Date: 04/17/2019 CLINICAL DATA:  Acute generalized upper abdominal pain for 1 month with nausea and vomiting since yesterday afternoon, history of pancreatitis EXAM: CT ABDOMEN AND PELVIS WITH CONTRAST TECHNIQUE: Multidetector CT imaging of the abdomen and pelvis was performed using the standard protocol following bolus administration of intravenous contrast. Sagittal and coronal MPR images reconstructed from axial data set. CONTRAST:  107mL OMNIPAQUE IOHEXOL 300 MG/ML SOLN IV. No oral contrast. COMPARISON:  None FINDINGS: Lower chest: Lung bases clear Hepatobiliary: Gallbladder and liver normal appearance Pancreas: Diffusely edematous pancreas compatible with acute pancreatitis. Lack of enhancement of an atrophic pancreatic body segment, question pancreatic necrosis though this could be acute or old.  Scattered calcifications at pancreatic tail, few calcifications at head and uncinate process. Small fluid collection at uncinate process consistent with pseudo cyst, 18 x 14 mm image 30. Additional small less well-defined fluid collection at head/body 8 x 6 mm. Edema of duodenal wall. Infiltrative changes extend to the LEFT medial margin of the gallbladder, surrounding duodenum, and extend into lesser sac. Spleen: Normal appearance.  Small adjacent splenule. Adrenals/Urinary Tract: Adrenal glands normal appearance. Tiny nonobstructing LEFT renal calculi. Kidneys, ureters, and bladder otherwise unremarkable. Stomach/Bowel: Normal appendix. Mild edema adjacent multiple small bowel loops without definite wall thickening. Minimal wall thickening of gastric antrum. Stomach otherwise decompressed. Vascular/Lymphatic: Atherosclerotic calcifications aorta and iliac arteries without aneurysm. Splenic and portal vein patent. 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 Reproductive: Unremarkable uterus and RIGHT ovary. Dominant follicle LEFT ovary 2.2 cm diameter. Small amount of air in vagina. Other: Small amount of free fluid in pelvis. No free air. No hernia. Musculoskeletal: Degenerative changes of the RIGHT hip joint. Grade 1 anterolisthesis L4-L5 secondary to facet degenerative changes. IMPRESSION: Changes of acute on chronic calcific pancreatitis with significant peripancreatic edema. Two small fluid collections are seen, 18 x 14 mm at uncinate process and 8 x 6 mm at head/body junction, consistent with pseudo cysts. Atrophic segment of pancreatic body with absent enhancement, concerning for pancreatic necrosis though this may potentially be the sequela of prior pancreatitis rather than acute necrosis due to the relative atrophy. 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 Findings called to Dr. Heron Sabins on 04/17/2019 at 1336 hrs. Electronically Signed   By: Lavonia Dana M.D.    On: 04/17/2019 13:38    Assessment/Plan Active Problems:   GERD (gastroesophageal reflux disease)   Acute on chronic pancreatitis (HCC)   Hypokalemia   Essential hypertension   Protein calorie malnutrition (HCC)   Marijuana abuse   Tobacco abuse   Asymptomatic bacteriuria   Macrocytosis   Pseudocyst of pancreas   Hypoalbuminemia   Mesenteric vein thrombosis (HCC)    Acute on chronic calcified pancreatitis: -Patient presented with epigastric pain and nausea.  Had previous history of acute pancreatitis.  Risk factors: Smoking and alcohol abuse. -Reviewed CT abdomen/pelvis result.  Lipase: WNL. -Start on IV fluids, Zofran PRN for nausea and vomiting and morphine for pain control.  We will keep her n.p.o. -Started on IV Zosyn for the concern of necrosis-consider GI consult if she does not improve  Superior mesenteric vein thrombus: -Ordered hypercoagulable work-up.  Patient denies history of DVT/PE or family history of hypercoagulation. -Started on full dose heparin in ED  Pancreatic pseudocyst: -Reviewed CT abdomen/pelvis result. -Monitor for now.  Hypokalemia: Replenished -Likely secondary to decreased p.o. intake  and constant nausea -Repeat BMP tomorrow a.m.  Hypertension: Uncontrolled -Secondary to noncompliance with home medication. -She takes amlodipine 10 mg p.o. daily at home.  Hold for now due to n.p.o. status -On hydralazine as needed for blood pressure more than 160/100.  Asymptomatic bacteriuria: -Patient denies any urinary symptoms. -No indication of antibiotics.  Patient is afebrile, no leukocytosis.  Tobacco abuse: -Started on nicotine patch as per patient's request  Marijuana abuse: UDS is positive for cannabis -Counseled about cessation.  Macrocytosis: -Likely secondary to alcohol abuse. -We will check TSH, folate and B12 level.  Protein calorie malnutrition: -Albumin is low.  Will consult dietitian for further management.    DVT prophylaxis: On  full dose Heparin code Status: Full code  family Communication: None present at bedside.  Plan of care discussed with patient in length and he verbalized understanding and agreed with it. Disposition Plan: TBD Consults called: None  admission status: Observation  Ollen Bowl MD [facility] Pager [phone]  If 7PM-7AM, please contact night-coverage [email or web address] Password [facility]  04/17/2019, 3:38 PM

## 2019-04-17 NOTE — Progress Notes (Signed)
Pharmacy Antibiotic Note  Sarah Burch is a 48 y.o. female admitted on 04/17/2019 with Intra-abdominal infection. Previously hospitalized for pancreatitis on 7/16. CT results show pancreatitis with pancreatic pseudocysts and superior mesenteric vein thrombosis. Afebrile, WBC 6.4, Scr 0.87. Pharmacy has been consulted for Zosyn dosing.  Plan: Zosyn 3.375 g IV 1x over 30 mins Zosyn 3.375 g IV q8h Monitor clinical improvement and renal function Monitor C/s  Height: 4\' 11"  (149.9 cm) Weight: 91 lb 11.4 oz (41.6 kg) IBW/kg (Calculated) : 43.2  Temp (24hrs), Avg:98.1 F (36.7 C), Min:97.8 F (36.6 C), Max:98.3 F (36.8 C)  Recent Labs  Lab 04/17/19 0712 04/17/19 1435  WBC 6.4  --   CREATININE 0.87  --   LATICACIDVEN  --  0.7    Estimated Creatinine Clearance: 50.8 mL/min (by C-G formula based on SCr of 0.87 mg/dL).    Allergies  Allergen Reactions  . Codeine Other (See Comments)    Cause dizziness    Antimicrobials this admission: Zosyn 9/30 >>  Dose adjustments this admission: N/A  Microbiology results: 9/30 UCx: pending 9/30 COVID: pending    Thank you for allowing pharmacy to be a part of this patient's care.  Lorel Monaco, PharmD PGY1 Ambulatory Care Resident [employer] # [phone]

## 2019-04-18 DIAGNOSIS — K55069 Acute infarction of intestine, part and extent unspecified: Secondary | ICD-10-CM

## 2019-04-18 LAB — BASIC METABOLIC PANEL
Anion gap: 9 (ref 5–15)
BUN: 6 mg/dL (ref 6–20)
CO2: 15 mmol/L — ABNORMAL LOW (ref 22–32)
Calcium: 8.2 mg/dL — ABNORMAL LOW (ref 8.9–10.3)
Chloride: 115 mmol/L — ABNORMAL HIGH (ref 98–111)
Creatinine, Ser: 0.81 mg/dL (ref 0.44–1.00)
GFR calc Af Amer: >60 mL/min (ref >60)
GFR calc non Af Amer: >60 mL/min (ref >60)
Glucose, Bld: 47 mg/dL — ABNORMAL LOW (ref 70–99)
Potassium: 3.4 mmol/L — ABNORMAL LOW (ref 3.5–5.1)
Sodium: 139 mmol/L (ref 135–145)

## 2019-04-18 LAB — ANA: Anti Nuclear Antibody (ANA): NEGATIVE

## 2019-04-18 LAB — CBC
HCT: 29.1 % — ABNORMAL LOW (ref 36.0–46.0)
Hemoglobin: 10 g/dL — ABNORMAL LOW (ref 12.0–15.0)
MCH: 36 pg — ABNORMAL HIGH (ref 26.0–34.0)
MCHC: 34.4 g/dL (ref 30.0–36.0)
MCV: 104.7 fL — ABNORMAL HIGH (ref 80.0–100.0)
Platelets: 270 10*3/uL (ref 150–400)
RBC: 2.78 MIL/uL — ABNORMAL LOW (ref 3.87–5.11)
RDW: 13.9 % (ref 11.5–15.5)
WBC: 7.6 10*3/uL (ref 4.0–10.5)
nRBC: 0 % (ref 0.0–0.2)

## 2019-04-18 LAB — HEPARIN LEVEL (UNFRACTIONATED)
Heparin Unfractionated: 0.31 IU/mL (ref 0.30–0.70)
Heparin Unfractionated: 0.44 IU/mL (ref 0.30–0.70)

## 2019-04-18 LAB — SARS CORONAVIRUS 2 (TAT 6-24 HRS): SARS Coronavirus 2: NEGATIVE

## 2019-04-18 MED ORDER — BOOST / RESOURCE BREEZE PO LIQD CUSTOM
1.0000 | Freq: Three times a day (TID) | ORAL | Status: DC
  Administered 2019-04-18 – 2019-04-24 (×11): 1 via ORAL

## 2019-04-18 MED ORDER — K PHOS MONO-SOD PHOS DI & MONO 155-852-130 MG PO TABS
250.0000 mg | ORAL_TABLET | Freq: Every day | ORAL | Status: DC
  Administered 2019-04-18: 250 mg via ORAL
  Filled 2019-04-18 (×2): qty 1

## 2019-04-18 MED ORDER — AMLODIPINE BESYLATE 10 MG PO TABS
10.0000 mg | ORAL_TABLET | Freq: Every day | ORAL | Status: DC
  Administered 2019-04-18 – 2019-04-25 (×8): 10 mg via ORAL
  Filled 2019-04-18 (×8): qty 1

## 2019-04-18 MED ORDER — POTASSIUM CHLORIDE CRYS ER 20 MEQ PO TBCR
40.0000 meq | EXTENDED_RELEASE_TABLET | ORAL | Status: AC
  Administered 2019-04-18 (×2): 40 meq via ORAL
  Filled 2019-04-18 (×2): qty 2

## 2019-04-18 MED ORDER — ADULT MULTIVITAMIN W/MINERALS CH
1.0000 | ORAL_TABLET | Freq: Every day | ORAL | Status: DC
  Administered 2019-04-18 – 2019-04-25 (×8): 1 via ORAL
  Filled 2019-04-18 (×8): qty 1

## 2019-04-18 NOTE — Plan of Care (Signed)
  Problem: Education: Goal: Knowledge of General Education information will improve Description: Including pain rating scale, medication(s)/side effects and non-pharmacologic comfort measures Outcome: Progressing   Problem: Pain Managment: Goal: General experience of comfort will improve Outcome: Progressing   

## 2019-04-18 NOTE — Progress Notes (Addendum)
PROGRESS NOTE    Sarah Burch   SAY:[id number]  DOB: 17-Mar-1971  DOA: 04/17/2019 PCP: Patient, No Pcp Per   Brief Narrative:  Sarah Burch  is a 48 y.o. female with medical history significant of chronic pancreatitis, GERD, hypertension, tobacco abuse, marijuana abuse presents to emergency department due to worsening epigastric pain and nausea since the day before coming to the ED.  Patient reports that her symptoms started last month however since yesterday it got worse.  She is unable to keep anything down.  Reports 10 out of 10 epigastric pain, radiates to her back, no aggravating factors, relieved with ibuprofen, associated with nausea.   CT scan of the abdomen pelvis in the ED notes the following:  Diffusely edematous pancreas compatible with acute pancreatitis. Lack of enhancement of an atrophic pancreatic body segment, question pancreatic necrosis though this could be acute or old. Scattered calcifications at pancreatic tail, few calcifications at head and uncinate process. Small fluid collection at uncinate process consistent with pseudo cyst, 18 x 14 mm image 30. Additional small less well-defined fluid collection at head/body 8 x 6 mm. Edema of duodenal wall. Infiltrative changes extend to the LEFT medial margin of the gallbladder, surrounding duodenum, and extend into lesser sac.  Subjective: The patient continues to have upper abdominal pain radiating to her back.  She is not vomiting and has been able to tolerate ice chips and sips of water.    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 Valetta Mole recommends IV heparin and if she tolerates this without bleeding, change to DOAC for 3 more months and then to aspirin   Hypokalemia, hypophosphatemia and hypomagnesemia -Replace and continue to follow  Metabolic acidosis -Likely secondary to elevated chloride level and possibly ketoacidosis due to being n.p.o. -Recheck tomorrow    GERD (gastroesophageal reflux disease) -Duodenum is edematous-this may be secondary to acute pancreatitis but may be acute duodenitis as well -Continue pantoprazole daily    Essential hypertension -Continue amlodipine    Protein calorie malnutrition-severe Body mass index is 18.52 kg/m. -We will start supplements when able to tolerate full liquids    Marijuana abuse   Tobacco abuse --She is only smoking 3 to 4 cigarettes now-DC nicotine patch    Asymptomatic bacteriuria -Greater than 100,000 gram-negative rods-hold off on treating    Macrocytosis -B12 level is normal-check folic acid-if this is normal as well, I suspect she may be drinking alcohol even if she denies it   Time spent in minutes: 35 DVT prophylaxis: Heparin infusion & SCDs Code Status: Full code Family Communication:  Disposition Plan: Home when stable Consultants:   None Procedures:   None Antimicrobials:  Anti-infectives (From admission, onward)   Start     Dose/Rate Route Frequency Ordered Stop   04/18/19 0000  piperacillin-tazobactam (ZOSYN) IVPB 3.375 g  Status:  Discontinued     3.375 g 12.5  mL/hr over 240 Minutes Intravenous Every 8 hours 04/17/19 1525 04/18/19 1131   04/17/19 1500  piperacillin-tazobactam (ZOSYN) IVPB 3.375 g     3.375 g 100 mL/hr over 30 Minutes Intravenous  Once 04/17/19 1447 04/17/19 1646      Objective: Vitals:   04/17/19 1955 04/17/19 2042 04/17/19 2139 04/18/19 0448  BP: (!) 179/99 (!) 184/84 (!) 148/93 (!) 141/75  Pulse: 70 65 80 76  Resp:    18  Temp: 98.7 F (37.1 C)   98.2 F (36.8 C)  TempSrc: Oral   Oral  SpO2: 99%   98%  Weight:      Height:        Intake/Output Summary (Last 24 hours) at 04/18/2019 1412 Last data filed at 04/18/2019 1135 Gross per 24 hour  Intake 2227.59 ml  Output 0 ml  Net 2227.59 ml   Filed Weights   04/17/19 1331  Weight: 41.6 kg    Examination: General exam: Appears comfortable  HEENT: PERRLA, oral mucosa moist, no sclera icterus or thrush Respiratory system: Clear to auscultation. Respiratory effort normal. Cardiovascular system: S1 & S2 heard, RRR.   Gastrointestinal system: Abdomen soft,  tender in epigastrium, nondistended. Normal bowel sounds. Central nervous system: Alert and oriented. No focal neurological deficits. Extremities: No cyanosis, clubbing or edema Skin: No rashes or ulcers Psychiatry:  Mood & affect appropriate.     Data Reviewed: I have personally reviewed following labs and imaging studies  CBC: Recent Labs  Lab 04/17/19 0712 04/18/19 0421  WBC 6.4 7.6  HGB 12.0 10.0*  HCT 36.2 29.1*  MCV 105.5* 104.7*  PLT 387 270   Basic Metabolic Panel: Recent Labs  Lab 04/17/19 0712 04/17/19 1530 04/17/19 2127 04/18/19 0421  NA 137  --   --  139  K 3.2*  --   --  3.4*  CL 109  --   --  115*  CO2 18*  --   --  15*  GLUCOSE 143*  --   --  47*  BUN 7  --   --  6  CREATININE 0.87  --   --  0.81  CALCIUM 8.6*  --   --  8.2*  MG  --  1.7 1.6*  --   PHOS  --  2.8 2.1*  --    GFR: Estimated Creatinine Clearance: 54.6 mL/min (by C-G formula based on SCr of 0.81 mg/dL). Liver Function Tests: Recent Labs  Lab 04/17/19 0712  AST 24  ALT 11  ALKPHOS 95  BILITOT 0.7  PROT 6.8  ALBUMIN 3.1*   Recent Labs  Lab 04/17/19 0712  LIPASE 42   No results for input(s): AMMONIA in the last 168 hours.  Coagulation Profile: No results for input(s): INR, PROTIME in the last 168 hours. Cardiac Enzymes: No results for input(s): CKTOTAL, CKMB, CKMBINDEX, TROPONINI in the last 168 hours. BNP (last 3 results) No results for input(s): PROBNP in the last 8760 hours. HbA1C: No results for input(s): HGBA1C in the last 72 hours. CBG: No results for input(s): GLUCAP in the last 168 hours. Lipid Profile: Recent Labs    04/17/19 1430  CHOL 148  HDL 39*  LDLCALC 92  TRIG 87  CHOLHDL 3.8   Thyroid Function Tests: Recent Labs    04/17/19 1530  TSH 0.869   Anemia Panel: Recent Labs    04/17/19 1530 04/17/19 2127  VITAMINB12 430 449   Urine analysis:    Component Value Date/Time   COLORURINE YELLOW 04/17/2019  1123   APPEARANCEUR HAZY (A) 04/17/2019 1123   LABSPEC 1.014 04/17/2019 1123   PHURINE 6.0 04/17/2019 1123   GLUCOSEU NEGATIVE 04/17/2019 1123   HGBUR NEGATIVE 04/17/2019 1123   BILIRUBINUR NEGATIVE 04/17/2019 1123   KETONESUR 20 (A) 04/17/2019 1123   PROTEINUR NEGATIVE 04/17/2019 1123   NITRITE POSITIVE (A) 04/17/2019 1123   LEUKOCYTESUR TRACE (A) 04/17/2019 1123   Sepsis Labs: @LABRCNTIP (procalcitonin:4,lacticidven:4) ) Recent Results (from the past 240 hour(s))  Urine culture     Status: Abnormal (Preliminary result)   Collection Time: 04/17/19 11:24 AM   Specimen: Urine, Random  Result Value Ref Range Status   Specimen Description URINE, RANDOM  Final   Special Requests   Final    NONE Performed at [facility], [address], [place], [place] [address]    Culture >=100,000 COLONIES/mL GRAM NEGATIVE RODS (A)  Final   Report Status PENDING  Incomplete  SARS CORONAVIRUS 2 (TAT 6-24 HRS) Nasopharyngeal Nasopharyngeal Swab     Status: None   Collection Time: 04/17/19  1:52 PM   Specimen: Nasopharyngeal Swab  Result Value Ref Range Status   SARS Coronavirus 2 NEGATIVE NEGATIVE Final    Comment: (NOTE) SARS-CoV-2 target nucleic acids are NOT DETECTED.  The SARS-CoV-2 RNA is generally detectable in upper and lower respiratory specimens during the acute phase of infection. Negative results do not preclude SARS-CoV-2 infection, do not rule out co-infections with other pathogens, and should not be used as the sole basis for treatment or other patient management decisions. Negative results must be combined with clinical observations, patient history, and epidemiological information. The expected result is Negative. Fact Sheet for Patients: HairSlick.no Fact Sheet for Healthcare Providers: quierodirigir.com This test is not yet approved or cleared by the [place] FDA and  has been authorized for detection and/or diagnosis of SARS-CoV-2 by FDA under an Emergency Use Authorization (EUA). This EUA will remain  in effect (meaning this test can be used) for the duration of the COVID-19 declaration under Section 56 4(b)(1) of the Act, 21 U.S.C. section 360bbb-3(b)(1), unless the authorization is terminated or revoked sooner. Performed at [facility], [address] [place], [place] [address]          Radiology Studies: Ct Abdomen Pelvis W Contrast  Result Date: 04/17/2019 CLINICAL DATA:  Acute generalized upper abdominal pain for 1 month with nausea and vomiting since yesterday afternoon, history of pancreatitis EXAM: CT ABDOMEN AND PELVIS WITH CONTRAST TECHNIQUE: Multidetector CT imaging of the abdomen and pelvis was performed using the standard protocol following bolus administration of intravenous contrast. Sagittal and coronal MPR images reconstructed from axial data set. CONTRAST:  90mL OMNIPAQUE IOHEXOL 300 MG/ML SOLN IV. No oral contrast. COMPARISON:  None FINDINGS: Lower chest: Lung bases clear Hepatobiliary: Gallbladder and liver normal appearance Pancreas: Diffusely edematous pancreas compatible with acute pancreatitis. Lack of enhancement of an atrophic pancreatic  body segment, question pancreatic necrosis though this could be acute or old. Scattered calcifications at pancreatic tail, few calcifications at head and uncinate process. Small fluid collection at uncinate process consistent with pseudo cyst, 18 x 14 mm image 30. Additional small less well-defined fluid collection at head/body 8 x 6 mm. Edema of duodenal wall. Infiltrative changes extend to the LEFT medial margin of the gallbladder, surrounding duodenum, and extend into lesser sac. Spleen: Normal appearance.  Small adjacent splenule. Adrenals/Urinary Tract: Adrenal glands normal appearance. Tiny nonobstructing LEFT renal calculi. Kidneys, ureters, and bladder otherwise unremarkable. Stomach/Bowel: Normal appendix. Mild  edema adjacent multiple small bowel loops without definite wall thickening. Minimal wall thickening of gastric antrum. Stomach otherwise decompressed. Vascular/Lymphatic: Atherosclerotic calcifications aorta and iliac arteries without aneurysm. Splenic and portal vein patent. 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 Reproductive: Unremarkable uterus and RIGHT ovary. Dominant follicle LEFT ovary 2.2 cm diameter. Small amount of air in vagina. Other: Small amount of free fluid in pelvis. No free air. No hernia. Musculoskeletal: Degenerative changes of the RIGHT hip joint. Grade 1 anterolisthesis L4-L5 secondary to facet degenerative changes. IMPRESSION: Changes of acute on chronic calcific pancreatitis with significant peripancreatic edema. Two small fluid collections are seen, 18 x 14 mm at uncinate process and 8 x 6 mm at head/body junction, consistent with pseudo cysts. Atrophic segment of pancreatic body with absent enhancement, concerning for pancreatic necrosis though this may potentially be the sequela of prior pancreatitis rather than acute necrosis due to the relative atrophy. 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 Findings called to Dr.  Fredirick Maudlin on 04/17/2019 at 1336 hrs. Electronically Signed   By: Ulyses Southward M.D.   On: 04/17/2019 13:38      Scheduled Meds: . amLODipine  10 mg Oral Daily  . pantoprazole  40 mg Oral Daily  . sodium chloride flush  3 mL Intravenous Once   Continuous Infusions: . sodium chloride 125 mL/hr at 04/18/19 0645  . heparin 750 Units/hr (04/17/19 2320)     LOS: 0 days      Calvert Cantor, MD [facility] Pager: [email or web address] Password [email or web address] 04/18/2019, 2:12 PM

## 2019-04-18 NOTE — Plan of Care (Signed)
  Problem: Education: Goal: Knowledge of General Education information will improve Description: Including pain rating scale, medication(s)/side effects and non-pharmacologic comfort measures Outcome: Progressing   Problem: Clinical Measurements: Goal: Diagnostic test results will improve Outcome: Progressing   Problem: Pain Managment: Goal: General experience of comfort will improve Outcome: Progressing   

## 2019-04-18 NOTE — Progress Notes (Signed)
Initial Nutrition Assessment  DOCUMENTATION CODES:   Severe malnutrition in context of chronic illness  INTERVENTION:    Boost Breeze po TID, each supplement provides 250 kcal and 9 grams of protein  MVI daily  Once diet advanced- Ensure Enlive po TID, each supplement provides 350 kcal and 20 grams of protein  NUTRITION DIAGNOSIS:   Severe Malnutrition related to chronic illness(chronic pancreatitis) as evidenced by energy intake < 75% for > or equal to 1 month, severe fat depletion, severe muscle depletion.  GOAL:   Patient will meet greater than or equal to 90% of their needs  MONITOR:   PO intake, Supplement acceptance, Weight trends, Labs, I & O's, Skin  REASON FOR ASSESSMENT:   Consult Assessment of nutrition requirement/status  ASSESSMENT:   Patient with PMH significant for chronic pancreatitis, GERD, HTN, and tobacco abuse. Presents this admission with acute pancreatitis and mesenteric vein thrombosis.   Pt reports appetite declined one month PTA due to abdominal pain upon eating. States during this time she would eat 1-2 small meals that consisted of fresh fruits, fresh vegetables, chicken, and some red meat. Over the last week, the pain worsened and she was unable to tolerate anything. Not one food item made her pain worse, reports it was inconsistent. She is abstaining from alcohol. She does not use supplementation as she cannot afford it. Discussed the importance of protein intake for preservation of lean body mass.Gave pt examples of high protein/high kcal food options she could have at home. Pt very appreciative. Will to have Boost and Ensure this admission.   Pt endorses a UBW of 126 lb and an unintentional wt loss of 10 lb within three months time frame. Records show pt weighed 91 lb on 7/10 (stated) and 91 lb this admission.   I/O: +3,748 ml since admit   Drips: NS @ 125 ml/hr  Medications: K phos Labs: K 3.4 (L) Phosphorus 2.1 (L) Mg 1.6 (L)      NUTRITION - FOCUSED PHYSICAL EXAM:    Most Recent Value  Orbital Region  Moderate depletion  Upper Arm Region  Severe depletion  Thoracic and Lumbar Region  Severe depletion  Buccal Region  Moderate depletion  Temple Region  Severe depletion  Clavicle Bone Region  Severe depletion  Clavicle and Acromion Bone Region  Severe depletion  Scapular Bone Region  Severe depletion  Dorsal Hand  Moderate depletion  Patellar Region  Severe depletion  Anterior Thigh Region  Severe depletion  Posterior Calf Region  Severe depletion  Edema (RD Assessment)  None  Hair  Reviewed  Eyes  Reviewed  Mouth  Reviewed  Skin  Reviewed  Nails  Reviewed       Diet Order:   Diet Order            Diet clear liquid Room service appropriate? Yes; Fluid consistency: Thin  Diet effective now              EDUCATION NEEDS:   Education needs have been addressed  Skin:  Skin Assessment: Reviewed RN Assessment  Last BM:  9/30  Height:   Ht Readings from Last 1 Encounters:  04/17/19 4\' 11"  (1.499 m)    Weight:   Wt Readings from Last 1 Encounters:  04/17/19 41.6 kg    Ideal Body Weight:  44.3 kg  BMI:  Body mass index is 18.52 kg/m.  Estimated Nutritional Needs:   Kcal:  1300-1500 kcal  Protein:  60-75 grams  Fluid:  >/= 1.3 L/day  Mariana Single RD, LDN Clinical Nutrition Pager # [phone]

## 2019-04-18 NOTE — Progress Notes (Signed)
New Admission Note: ? Arrival Method: Stretcher Mental Orientation: Alert and Oriented x 4 Telemetry: [id number] Assessment: Completed Skin: Refer to flowsheet IV: Left Antecubital and Right Forearm Pain: 0 Tubes: None Safety Measures: Safety Fall Prevention Plan discussed with patient. Admission: Completed [facility] [place] Orientation: Patient has been orientated to the room, unit and the staff. Family: None at the bedtime at this time Orders have been reviewed and are being implemented. Will continue to monitor the patient. Call light has been placed within reach and bed alarm has been activated.  ? Milagros Loll, RN  Phone Number: [phone]

## 2019-04-18 NOTE — Progress Notes (Signed)
ANTICOAGULATION CONSULT NOTE - Follow-Up  Pharmacy Consult for heparin Indication: SMV thrombosis  Allergies  Allergen Reactions  . Codeine Other (See Comments)    Cause dizziness    Patient Measurements: Height: 4\' 11"  (149.9 cm) Weight: 91 lb 11.4 oz (41.6 kg) IBW/kg (Calculated) : 43.2 Heparin Dosing Weight: 41.6kg   Vital Signs: Temp: 98.2 F (36.8 C) (10/01 0448) Temp Source: Oral (10/01 0448) BP: 141/75 (10/01 0448) Pulse Rate: 76 (10/01 0448)  Labs: Recent Labs    04/17/19 0712 04/17/19 2127 04/18/19 0421  HGB 12.0  --  10.0*  HCT 36.2  --  29.1*  PLT 387  --  270  HEPARINUNFRC  --  0.25* 0.31  CREATININE 0.87  --  0.81    Estimated Creatinine Clearance: 54.6 mL/min (by C-G formula based on SCr of 0.81 mg/dL).   Assessment: 48 yo F presented to the ED with abdominal pain. 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 Started on IV heparin. Baseline CBC is WNL.  Heparin level is at lower end of goal on 750 units/hr.  Goal of Therapy:  Heparin level 0.3-0.7 units/ml Monitor platelets by anticoagulation protocol: Yes   Plan:  Continue heparin gtt at 750 units/hr Confirmation heparin level ordered for 2pm Daily heparin level and CBC  Clevland Cork, Pharm.D., BCPS Clinical Pharmacist Clinical phone for 04/18/2019 from 8:30-4:00 is [phone].  **Pharmacist phone directory can now be found on [email or web address] ([email or web address]).  Listed under [place].  04/18/2019 1:23 PM

## 2019-04-18 NOTE — Progress Notes (Signed)
ANTICOAGULATION CONSULT NOTE - Follow-Up  Pharmacy Consult for heparin Indication: SMV thrombosis  Allergies  Allergen Reactions  . Codeine Other (See Comments)    Cause dizziness    Patient Measurements: Height: 4\' 11"  (149.9 cm) Weight: 91 lb 11.4 oz (41.6 kg) IBW/kg (Calculated) : 43.2 Heparin Dosing Weight: 41.6kg   Vital Signs: Temp: 98.2 F (36.8 C) (10/01 0448) Temp Source: Oral (10/01 0448) BP: 141/75 (10/01 0448) Pulse Rate: 76 (10/01 0448)  Labs: Recent Labs    04/17/19 0712 04/17/19 2127 04/18/19 0421 04/18/19 1447  HGB 12.0  --  10.0*  --   HCT 36.2  --  29.1*  --   PLT 387  --  270  --   HEPARINUNFRC  --  0.25* 0.31 0.44  CREATININE 0.87  --  0.81  --     Estimated Creatinine Clearance: 54.6 mL/min (by C-G formula based on SCr of 0.81 mg/dL).   Assessment: 48 yo F presented to the ED with abdominal pain. 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 Started on IV heparin. Baseline CBC is WNL.  Heparin level within goal  Goal of Therapy:  Heparin level 0.3-0.7 units/ml Monitor platelets by anticoagulation protocol: Yes   Plan:  Continue heparin gtt at 750 units/hr Daily HL CBC  Levester Fresh, PharmD, BCPS, BCCCP Clinical Pharmacist [phone]  Please check AMION for all [place] numbers  04/18/2019 3:34 PM

## 2019-04-19 DIAGNOSIS — E43 Unspecified severe protein-calorie malnutrition: Secondary | ICD-10-CM | POA: Insufficient documentation

## 2019-04-19 LAB — BASIC METABOLIC PANEL
Anion gap: 9 (ref 5–15)
BUN: <5 mg/dL — ABNORMAL LOW (ref 6–20)
CO2: 17 mmol/L — ABNORMAL LOW (ref 22–32)
Calcium: 8.4 mg/dL — ABNORMAL LOW (ref 8.9–10.3)
Chloride: 111 mmol/L (ref 98–111)
Creatinine, Ser: 0.7 mg/dL (ref 0.44–1.00)
GFR calc Af Amer: >60 mL/min (ref >60)
GFR calc non Af Amer: >60 mL/min (ref >60)
Glucose, Bld: 69 mg/dL — ABNORMAL LOW (ref 70–99)
Potassium: 3.8 mmol/L (ref 3.5–5.1)
Sodium: 137 mmol/L (ref 135–145)

## 2019-04-19 LAB — URINE CULTURE: Culture: >=100000 — AB

## 2019-04-19 LAB — CBC
HCT: 31.7 % — ABNORMAL LOW (ref 36.0–46.0)
Hemoglobin: 10.6 g/dL — ABNORMAL LOW (ref 12.0–15.0)
MCH: 35.1 pg — ABNORMAL HIGH (ref 26.0–34.0)
MCHC: 33.4 g/dL (ref 30.0–36.0)
MCV: 105 fL — ABNORMAL HIGH (ref 80.0–100.0)
Platelets: 261 10*3/uL (ref 150–400)
RBC: 3.02 MIL/uL — ABNORMAL LOW (ref 3.87–5.11)
RDW: 13.9 % (ref 11.5–15.5)
WBC: 6.7 10*3/uL (ref 4.0–10.5)
nRBC: 0 % (ref 0.0–0.2)

## 2019-04-19 LAB — FOLATE RBC
Folate, Hemolysate: 279 ng/mL
Folate, RBC: 872 ng/mL (ref >498)
Hematocrit: 32 % — ABNORMAL LOW (ref 34.0–46.6)

## 2019-04-19 LAB — MAGNESIUM: Magnesium: 1.5 mg/dL — ABNORMAL LOW (ref 1.7–2.4)

## 2019-04-19 LAB — ANA: Anti Nuclear Antibody (ANA): NEGATIVE

## 2019-04-19 LAB — HEPARIN LEVEL (UNFRACTIONATED): Heparin Unfractionated: 0.3 IU/mL (ref 0.30–0.70)

## 2019-04-19 LAB — PHOSPHORUS: Phosphorus: 2.6 mg/dL (ref 2.5–4.6)

## 2019-04-19 MED ORDER — PANTOPRAZOLE SODIUM 40 MG PO TBEC
40.0000 mg | DELAYED_RELEASE_TABLET | Freq: Two times a day (BID) | ORAL | Status: DC
  Administered 2019-04-19 – 2019-04-25 (×11): 40 mg via ORAL
  Filled 2019-04-19 (×11): qty 1

## 2019-04-19 MED ORDER — K PHOS MONO-SOD PHOS DI & MONO 155-852-130 MG PO TABS
250.0000 mg | ORAL_TABLET | Freq: Every day | ORAL | Status: AC
  Administered 2019-04-19: 250 mg via ORAL
  Filled 2019-04-19: qty 1

## 2019-04-19 MED ORDER — MAGNESIUM SULFATE 2 GM/50ML IV SOLN
2.0000 g | Freq: Once | INTRAVENOUS | Status: AC
  Administered 2019-04-19: 2 g via INTRAVENOUS
  Filled 2019-04-19: qty 50

## 2019-04-19 MED ORDER — MORPHINE SULFATE (PF) 2 MG/ML IV SOLN
2.0000 mg | INTRAVENOUS | Status: DC | PRN
  Administered 2019-04-19 – 2019-04-25 (×23): 2 mg via INTRAVENOUS
  Filled 2019-04-19 (×23): qty 1

## 2019-04-19 NOTE — Progress Notes (Signed)
ANTICOAGULATION CONSULT NOTE - Follow-Up  Pharmacy Consult for heparin Indication: SMV thrombosis  Allergies  Allergen Reactions  . Codeine Other (See Comments)    Cause dizziness    Patient Measurements: Height: 4\' 11"  (149.9 cm) Weight: 91 lb 11.4 oz (41.6 kg) IBW/kg (Calculated) : 43.2 Heparin Dosing Weight: 41.6kg   Vital Signs: Temp: 98.5 F (36.9 C) (10/02 0912) Temp Source: Oral (10/02 0912) BP: 169/89 (10/02 0912) Pulse Rate: 79 (10/02 0912)  Labs: Recent Labs    04/17/19 0712  04/18/19 0421 04/18/19 1447 04/19/19 0358  HGB 12.0  --  10.0*  --  10.6*  HCT 36.2  --  29.1*  --  31.7*  PLT 387  --  270  --  261  HEPARINUNFRC  --    < > 0.31 0.44 0.30  CREATININE 0.87  --  0.81  --  0.70   < > = values in this interval not displayed.    Estimated Creatinine Clearance: 55.3 mL/min (by C-G formula based on SCr of 0.7 mg/dL).   Assessment: 48 yo F presented to the ED with abdominal pain. 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 Started on IV heparin. Baseline CBC is WNL.  Heparin level remains at lower end of goal on 750 units/hr.  Will increase slightly.  Goal of Therapy:  Heparin level 0.3-0.7 units/ml Monitor platelets by anticoagulation protocol: Yes   Plan:  Increase heparin gtt to 800 units/hr Confirmation heparin level ordered for 2pm Daily heparin level and CBC  Alyus Mofield, Pharm.D., BCPS Clinical Pharmacist Clinical phone for 04/19/2019 from 8:30-4:00 is [phone].  **Pharmacist phone directory can now be found on [email or web address] ([email or web address]).  Listed under [place].  04/19/2019 9:36 AM

## 2019-04-19 NOTE — Plan of Care (Signed)
  Problem: Education: Goal: Knowledge of General Education information will improve Description: Including pain rating scale, medication(s)/side effects and non-pharmacologic comfort measures Outcome: Progressing   Problem: Pain Managment: Goal: General experience of comfort will improve Outcome: Progressing   

## 2019-04-19 NOTE — Plan of Care (Signed)
  Problem: Education: Goal: Knowledge of General Education information will improve Description: Including pain rating scale, medication(s)/side effects and non-pharmacologic comfort measures Outcome: Progressing   Problem: Activity: Goal: Risk for activity intolerance will decrease Outcome: Progressing   

## 2019-04-19 NOTE — Progress Notes (Signed)
PROGRESS NOTE    Sarah Burch   ZOX:[id number]  DOB: 1970/12/12  DOA: 04/17/2019 PCP: Patient, No Pcp Per   Brief Narrative:  Sarah Burch  is a 48 y.o. female with medical history significant of chronic pancreatitis, GERD, hypertension, tobacco abuse, marijuana abuse presents to emergency department due to worsening epigastric pain and nausea since the day before coming to the ED.  Patient reports that her symptoms started last month however since yesterday it got worse.  She is unable to keep anything down.  Reports 10 out of 10 epigastric pain, radiates to her back, no aggravating factors, relieved with ibuprofen, associated with nausea.   CT scan of the abdomen pelvis in the ED notes the following:  Diffusely edematous pancreas compatible with acute pancreatitis. Lack of enhancement of an atrophic pancreatic body segment, question pancreatic necrosis though this could be acute or old. Scattered calcifications at pancreatic tail, few calcifications at head and uncinate process. Small fluid collection at uncinate process consistent with pseudo cyst, 18 x 14 mm image 30. Additional small less well-defined fluid collection at head/body 8 x 6 mm. Edema of duodenal wall. Infiltrative changes extend to the LEFT medial margin of the gallbladder, surrounding duodenum, and extend into lesser sac.  Subjective: She has ongoing mid abdominal pain and Morphine does not last long enough. She vomited last night and again this AM when trying clear liquids.     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 Valetta Mole recommends IV heparin and if she tolerates this without bleeding, change to DOAC for 3 more months and then to aspirin  - Cont Heparin for now  Hypokalemia, hypophosphatemia and hypomagnesemia -Replace and continue to follow  Metabolic acidosis -Likely secondary to elevated chloride level and possibly ketoacidosis due to being n.p.o. - slightly improved today    GERD (gastroesophageal reflux disease) -Duodenum is edematous-this may be secondary to acute pancreatitis but may be acute duodenitis as well -Continue pantoprazole daily- increase to BID    Essential hypertension -Continue amlodipine    Protein calorie malnutrition-severe Body mass index is 18.52 kg/m. -We will start supplements when able to tolerate full liquids    Marijuana abuse   Tobacco abuse --She is only smoking 3 to 4 cigarettes now- counseled    Asymptomatic bacteriuria -Greater than 100,000 e coli-hold off on treating as she is asymptomatic    Macrocytosis -B12 level is normal-  folic acid level pending-if this is normal as well, I suspect she may be drinking alcohol even if she denies it   Time spent in minutes: 35 DVT prophylaxis: Heparin infusion & SCDs Code Status: Full code Family Communication:  Disposition Plan: Home when stable Consultants:   None Procedures:   None Antimicrobials:  Anti-infectives (From admission, onward)   Start     Dose/Rate Route Frequency Ordered Stop   04/18/19 0000  piperacillin-tazobactam (ZOSYN) IVPB 3.375 g  Status:  Discontinued  3.375 g 12.5 mL/hr over 240 Minutes Intravenous Every 8 hours 04/17/19 1525 04/18/19 1131   04/17/19 1500  piperacillin-tazobactam (ZOSYN) IVPB 3.375 g     3.375 g 100 mL/hr over 30 Minutes Intravenous  Once 04/17/19 1447 04/17/19 1646      Objective: Vitals:   04/18/19 1800 04/18/19 2055 04/19/19 0438 04/19/19 0912  BP: 131/88 (!) 147/83 (!) 159/90 (!) 169/89  Pulse: 91 84 78 79  Resp: 20 18 18 18   Temp: 98.4 F (36.9 C) 98.5 F (36.9 C) 97.9 F (36.6 C) 98.5 F (36.9 C)  TempSrc: Oral Oral  Oral  SpO2: 99% 99% 98% 99%  Weight:      Height:        Intake/Output Summary (Last 24 hours) at 04/19/2019 1243 Last data filed at 04/19/2019 0814 Gross per 24 hour  Intake 630.62 ml  Output 0 ml  Net 630.62 ml   Filed Weights   04/17/19 1331  Weight: 41.6 kg    Examination: General exam: Appears comfortable  HEENT: PERRLA, oral mucosa moist, no sclera icterus or thrush Respiratory system: Clear to auscultation. Respiratory effort normal. Cardiovascular system: S1 & S2 heard,  No murmurs  Gastrointestinal system: Abdomen soft, tender in mid abdomen, nondistended. Normal bowel sounds   Central nervous system: Alert and oriented. No focal neurological deficits. Extremities: No cyanosis, clubbing or edema Skin: No rashes or ulcers Psychiatry:  crying, upset    Data Reviewed: I have personally reviewed following labs and imaging studies  CBC: Recent Labs  Lab 04/17/19 0712 04/18/19 0421 04/19/19 0358  WBC 6.4 7.6 6.7  HGB 12.0 10.0* 10.6*  HCT 36.2 29.1* 31.7*  MCV 105.5* 104.7* 105.0*  PLT 387 270 261   Basic Metabolic Panel: Recent Labs  Lab 04/17/19 0712 04/17/19 1530 04/17/19 2127 04/18/19 0421 04/19/19 0358  NA 137  --   --  139 137  K 3.2*  --   --  3.4* 3.8  CL 109  --   --  115* 111  CO2 18*  --   --  15* 17*  GLUCOSE 143*  --   --  47* 69*  BUN 7  --   --  6 <5*  CREATININE 0.87  --   --  0.81 0.70  CALCIUM 8.6*  --   --  8.2* 8.4*  MG  --  1.7 1.6*  --  1.5*  PHOS  --  2.8 2.1*  --  2.6   GFR: Estimated Creatinine Clearance: 55.3 mL/min (by C-G formula based on SCr of 0.7 mg/dL). Liver Function Tests: Recent Labs  Lab 04/17/19 0712  AST 24  ALT 11  ALKPHOS 95  BILITOT 0.7   PROT 6.8  ALBUMIN 3.1*   Recent Labs  Lab 04/17/19 0712  LIPASE 42   No results for input(s): AMMONIA in the last 168 hours. Coagulation Profile: No results for input(s): INR, PROTIME in the last 168 hours. Cardiac Enzymes: No results for input(s): CKTOTAL, CKMB, CKMBINDEX, TROPONINI in the last 168 hours. BNP (last 3 results) No results for input(s): PROBNP in the last 8760 hours. HbA1C: No results for input(s): HGBA1C in the last 72 hours. CBG: No results for input(s): GLUCAP in the last 168 hours. Lipid Profile: Recent Labs    04/17/19 1430  CHOL 148  HDL 39*  LDLCALC 92  TRIG 87  CHOLHDL 3.8   Thyroid Function Tests: Recent Labs    04/17/19 1530  TSH 0.869   Anemia Panel: Recent  Labs    04/17/19 1530 04/17/19 2127  VITAMINB12 430 449   Urine analysis:    Component Value Date/Time   COLORURINE YELLOW 04/17/2019 1123   APPEARANCEUR HAZY (A) 04/17/2019 1123   LABSPEC 1.014 04/17/2019 1123   PHURINE 6.0 04/17/2019 1123   GLUCOSEU NEGATIVE 04/17/2019 1123   HGBUR NEGATIVE 04/17/2019 1123   Bryan 04/17/2019 1123   KETONESUR 20 (A) 04/17/2019 1123   PROTEINUR NEGATIVE 04/17/2019 1123   NITRITE POSITIVE (A) 04/17/2019 1123   LEUKOCYTESUR TRACE (A) 04/17/2019 1123   Sepsis Labs: @LABRCNTIP (procalcitonin:4,lacticidven:4) ) Recent Results (from the past 240 hour(s))  Urine culture     Status: Abnormal   Collection Time: 04/17/19 11:24 AM   Specimen: Urine, Random  Result Value Ref Range Status   Specimen Description URINE, RANDOM  Final   Special Requests   Final    NONE Performed at [facility], [place] [address], [place], [place] [address]    Culture >=100,000 COLONIES/mL ESCHERICHIA COLI (A)  Final   Report Status 04/19/2019 FINAL  Final   Organism ID, Bacteria ESCHERICHIA COLI (A)  Final      Susceptibility   Escherichia coli - MIC*    AMPICILLIN >=32 RESISTANT Resistant     CEFAZOLIN 8 SENSITIVE Sensitive     CEFTRIAXONE  <=1 SENSITIVE Sensitive     CIPROFLOXACIN <=0.25 SENSITIVE Sensitive     GENTAMICIN >=16 RESISTANT Resistant     IMIPENEM <=0.25 SENSITIVE Sensitive     NITROFURANTOIN <=16 SENSITIVE Sensitive     TRIMETH/SULFA >=320 RESISTANT Resistant     AMPICILLIN/SULBACTAM >=32 RESISTANT Resistant     PIP/TAZO 8 SENSITIVE Sensitive     Extended ESBL NEGATIVE Sensitive     * >=100,000 COLONIES/mL ESCHERICHIA COLI  SARS CORONAVIRUS 2 (TAT 6-24 HRS) Nasopharyngeal Nasopharyngeal Swab     Status: None   Collection Time: 04/17/19  1:52 PM   Specimen: Nasopharyngeal Swab  Result Value Ref Range Status   SARS Coronavirus 2 NEGATIVE NEGATIVE Final    Comment: (NOTE) SARS-CoV-2 target nucleic acids are NOT DETECTED. The SARS-CoV-2 RNA is generally detectable in upper and lower respiratory specimens during the acute phase of infection. Negative results do not preclude SARS-CoV-2 infection, do not rule out co-infections with other pathogens, and should not be used as the sole basis for treatment or other patient management decisions. Negative results must be combined with clinical observations, patient history, and epidemiological information. The expected result is Negative. Fact Sheet for Patients: SugarRoll.be Fact Sheet for Healthcare Providers: https://www.woods-mathews.com/ This test is not yet approved or cleared by the [place] FDA and  has been authorized for detection and/or diagnosis of SARS-CoV-2 by FDA under an Emergency Use Authorization (EUA). This EUA will remain  in effect (meaning this test can be used) for the duration of the COVID-19 declaration under Section 56 4(b)(1) of the Act, 21 U.S.C. section 360bbb-3(b)(1), unless the authorization is terminated or revoked sooner. Performed at [facility], [place] [address] [place], [place] [address]          Radiology Studies: Ct Abdomen Pelvis W Contrast  Result Date:  04/17/2019 CLINICAL DATA:  Acute generalized upper abdominal pain for 1 month with nausea and vomiting since yesterday afternoon, history of pancreatitis EXAM: CT ABDOMEN AND PELVIS WITH CONTRAST TECHNIQUE: Multidetector CT imaging of the abdomen and pelvis was performed using the standard protocol following bolus administration of intravenous contrast. Sagittal and coronal MPR images reconstructed from axial data set. CONTRAST:  48mL OMNIPAQUE IOHEXOL 300 MG/ML SOLN IV. No oral contrast. COMPARISON:  None FINDINGS: Lower chest: Lung bases clear Hepatobiliary: Gallbladder and liver normal appearance Pancreas: Diffusely edematous pancreas compatible with acute pancreatitis. Lack of enhancement of an atrophic pancreatic body segment, question pancreatic necrosis though this could be acute or old. Scattered calcifications at pancreatic tail, few calcifications at head and uncinate process. Small fluid collection at uncinate process consistent with pseudo cyst, 18 x 14 mm image 30. Additional small less well-defined fluid collection at head/body 8 x 6 mm. Edema of duodenal wall. Infiltrative changes extend to the LEFT medial margin of the gallbladder, surrounding duodenum, and extend into lesser sac. Spleen: Normal appearance.  Small adjacent splenule. Adrenals/Urinary Tract: Adrenal glands normal appearance. Tiny nonobstructing LEFT renal calculi. Kidneys, ureters, and bladder otherwise unremarkable. Stomach/Bowel: Normal appendix. Mild edema adjacent multiple small bowel loops without definite wall thickening. Minimal wall thickening of gastric antrum. Stomach otherwise decompressed. Vascular/Lymphatic: Atherosclerotic calcifications aorta and iliac arteries without aneurysm. Splenic and portal vein patent. 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 Reproductive: Unremarkable uterus and RIGHT ovary. Dominant follicle LEFT ovary 2.2 cm diameter. Small amount of air in vagina.  Other: Small amount of free fluid in pelvis. No free air. No hernia. Musculoskeletal: Degenerative changes of the RIGHT hip joint. Grade 1 anterolisthesis L4-L5 secondary to facet degenerative changes. IMPRESSION: Changes of acute on chronic calcific pancreatitis with significant peripancreatic edema. Two small fluid collections are seen, 18 x 14 mm at uncinate process and 8 x 6 mm at head/body junction, consistent with pseudo cysts. Atrophic segment of pancreatic body with absent enhancement, concerning for pancreatic necrosis though this may potentially be the sequela of prior pancreatitis rather than acute necrosis due to the relative atrophy. 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 Findings called to Dr. Fredirick Maudlin on 04/17/2019 at 1336 hrs. Electronically Signed   By: Ulyses Southward M.D.   On: 04/17/2019 13:38      Scheduled Meds:  amLODipine  10 mg Oral Daily   feeding supplement  1 Container Oral TID BM   multivitamin with minerals  1 tablet Oral Daily   pantoprazole  40 mg Oral Daily   sodium chloride flush  3 mL Intravenous Once   Continuous Infusions:  sodium chloride 125 mL/hr at 04/19/19 0228   heparin 800 Units/hr (04/19/19 1000)     LOS: 1 day      Calvert Cantor, MD [facility] Pager: [email or web address] Password [facility] 04/19/2019, 12:43 PM

## 2019-04-20 ENCOUNTER — Inpatient Hospital Stay (HOSPITAL_COMMUNITY): Payer: Self-pay

## 2019-04-20 ENCOUNTER — Other Ambulatory Visit: Payer: Self-pay

## 2019-04-20 LAB — CBC
HCT: 28.5 % — ABNORMAL LOW (ref 36.0–46.0)
Hemoglobin: 9.4 g/dL — ABNORMAL LOW (ref 12.0–15.0)
MCH: 34.9 pg — ABNORMAL HIGH (ref 26.0–34.0)
MCHC: 33 g/dL (ref 30.0–36.0)
MCV: 105.9 fL — ABNORMAL HIGH (ref 80.0–100.0)
Platelets: 235 10*3/uL (ref 150–400)
RBC: 2.69 MIL/uL — ABNORMAL LOW (ref 3.87–5.11)
RDW: 13.8 % (ref 11.5–15.5)
WBC: 5.8 10*3/uL (ref 4.0–10.5)
nRBC: 0 % (ref 0.0–0.2)

## 2019-04-20 LAB — BASIC METABOLIC PANEL
Anion gap: 6 (ref 5–15)
BUN: <5 mg/dL — ABNORMAL LOW (ref 6–20)
CO2: 19 mmol/L — ABNORMAL LOW (ref 22–32)
Calcium: 8 mg/dL — ABNORMAL LOW (ref 8.9–10.3)
Chloride: 113 mmol/L — ABNORMAL HIGH (ref 98–111)
Creatinine, Ser: 0.59 mg/dL (ref 0.44–1.00)
GFR calc Af Amer: >60 mL/min (ref >60)
GFR calc non Af Amer: >60 mL/min (ref >60)
Glucose, Bld: 130 mg/dL — ABNORMAL HIGH (ref 70–99)
Potassium: 3.2 mmol/L — ABNORMAL LOW (ref 3.5–5.1)
Sodium: 138 mmol/L (ref 135–145)

## 2019-04-20 LAB — HEPARIN LEVEL (UNFRACTIONATED)
Heparin Unfractionated: 0.26 IU/mL — ABNORMAL LOW (ref 0.30–0.70)
Heparin Unfractionated: 0.22 IU/mL — ABNORMAL LOW (ref 0.30–0.70)
Heparin Unfractionated: 0.17 IU/mL — ABNORMAL LOW (ref 0.30–0.70)

## 2019-04-20 LAB — MAGNESIUM: Magnesium: 1.6 mg/dL — ABNORMAL LOW (ref 1.7–2.4)

## 2019-04-20 MED ORDER — GADOBUTROL 1 MMOL/ML IV SOLN
4.0000 mL | Freq: Once | INTRAVENOUS | Status: AC | PRN
  Administered 2019-04-20: 4 mL via INTRAVENOUS

## 2019-04-20 MED ORDER — POTASSIUM CHLORIDE IN NACL 40-0.9 MEQ/L-% IV SOLN
INTRAVENOUS | Status: DC
  Administered 2019-04-20 – 2019-04-21 (×3): 125 mL/h via INTRAVENOUS
  Filled 2019-04-20 (×4): qty 1000

## 2019-04-20 MED ORDER — PANCRELIPASE (LIP-PROT-AMYL) 12000-38000 UNITS PO CPEP
24000.0000 [IU] | ORAL_CAPSULE | Freq: Three times a day (TID) | ORAL | Status: DC
  Administered 2019-04-20 – 2019-04-25 (×14): 24000 [IU] via ORAL
  Filled 2019-04-20 (×14): qty 2

## 2019-04-20 MED ORDER — PANCRELIPASE (LIP-PROT-AMYL) 12000-38000 UNITS PO CPEP: 12000.0000 [IU] | ORAL_CAPSULE | Freq: Three times a day (TID) | ORAL | Status: DC

## 2019-04-20 NOTE — Plan of Care (Signed)
  Problem: Clinical Measurements: Goal: Ability to maintain clinical measurements within normal limits will improve Outcome: Progressing   

## 2019-04-20 NOTE — Progress Notes (Signed)
PROGRESS NOTE    Sarah Burch   BOF:[id number]  DOB: 16-Nov-1970  DOA: 04/17/2019 PCP: Patient, No Pcp Per   Brief Narrative:  MARCELLA CHARLSON  is a 48 y.o. female with medical history significant of chronic pancreatitis, GERD, hypertension, tobacco abuse, marijuana abuse presents to emergency department due to worsening epigastric pain and nausea starting the day before coming to the ED.  Patient reports that her symptoms started last month however got worse yesterday.  She is unable to keep anything down.  Reports 10 out of 10 epigastric pain, radiates to her back, no aggravating factors, relieved with ibuprofen, associated with nausea.   CT scan of the abdomen pelvis in the ED notes the following:  Diffusely edematous pancreas compatible with acute pancreatitis. Lack of enhancement of an atrophic pancreatic body segment, question pancreatic necrosis though this could be acute or old. Scattered calcifications at pancreatic tail, few calcifications at head and uncinate process. Small fluid collection at uncinate process consistent with pseudo cyst, 18 x 14 mm image 30. Additional small less well-defined fluid collection at head/body 8 x 6 mm. Edema of duodenal wall. Infiltrative changes extend to the LEFT medial margin of the gallbladder, surrounding duodenum, and extend into lesser sac.  Subjective: She vomited again this AM after eating grits. She is frustrated that she is not improving.     Assessment & Plan:   Principal Problem:   Acute pancreatitis-  Pseudocyst of pancreas -The patient states that she is abstaining from alcohol-triglycerides not elevated- current medication - her lipase is normal however, CT scan is suggestive of an edematous pancreas with calcifications suggestive of chronic pancreatitis -Continue normal saline at 125 cc an hour - increased frequency of Morphine to Every 3 hrs PRN - advanced to full liquids on 10/2 on patient's requested- she vomited up grits-  will add Pancrealipase today and obtain an MRCP.    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 Valetta Mole recommends IV heparin and if she tolerates this without bleeding, change to DOAC for 3 more months and then to aspirin  - Cont Heparin for now  Hypokalemia, hypophosphatemia and hypomagnesemia -Replace again today and continue to follow  Metabolic acidosis -Likely secondary to elevated chloride level and possibly ketoacidosis due to being n.p.o. - slightly improved today    GERD (gastroesophageal reflux disease) -Duodenum is edematous-this may be secondary to acute pancreatitis but may be acute duodenitis as well -Continue pantoprazole daily- increased to BID    Essential hypertension -Continue amlodipine    Protein calorie malnutrition-severe Body mass index is 18.52 kg/m. -  started supplements      Marijuana abuse   Tobacco abuse --She is only smoking 3 to 4 cigarettes now- counseled    Asymptomatic bacteriuria -Greater than 100,000 e coli-hold off on treating as she is asymptomatic    Macrocytosis -B12 level is normal-  folic acid level still pending-if this is normal as well, I suspect she may be drinking alcohol even if she denies it   Time spent in minutes: 35 DVT prophylaxis: Heparin infusion & SCDs Code Status: Full code Family Communication:  Disposition Plan: Home when stable Consultants:   None Procedures:   None Antimicrobials:  Anti-infectives (From admission, onward)   Start     Dose/Rate Route Frequency Ordered Stop   04/18/19 0000  piperacillin-tazobactam (ZOSYN) IVPB 3.375 g  Status:  Discontinued     3.375 g 12.5 mL/hr over 240 Minutes Intravenous Every 8 hours 04/17/19 1525 04/18/19 1131   04/17/19 1500  piperacillin-tazobactam (ZOSYN) IVPB 3.375 g     3.375 g 100 mL/hr over 30 Minutes Intravenous  Once 04/17/19 1447 04/17/19  1646     Objective: Vitals:   04/19/19 0912 04/19/19 2108 04/20/19 0508 04/20/19 0916  BP: (!) 169/89 130/74 131/82 (!) 144/81  Pulse: 79 77 71 70  Resp: 18 18 18 18   Temp: 98.5 F (36.9 C) 98.4 F (36.9 C) 98.4 F (36.9 C) 98.1 F (36.7 C)  TempSrc: Oral Oral Oral Oral  SpO2: 99% 99% 99% 99%  Weight:      Height:        Intake/Output Summary (Last 24 hours) at 04/20/2019 1039 Last data filed at 04/20/2019 0900 Gross per 24 hour  Intake 580 ml  Output 0 ml  Net 580 ml   Filed Weights   04/17/19 1331  Weight: 41.6 kg    Examination: General exam: Appears comfortable  HEENT: PERRLA, oral mucosa moist, no sclera icterus or thrush Respiratory system: Clear to auscultation. Respiratory effort normal. Cardiovascular system: S1 & S2 heard,  No murmurs  Gastrointestinal system: Abdomen soft,  Tender in epigastrium, nondistended. Normal bowel sounds   Central nervous system: Alert and oriented. No focal neurological deficits. Extremities: No cyanosis, clubbing or edema Skin: No rashes or ulcers Psychiatry:  depressed, crying     Data Reviewed: I have personally reviewed following labs and imaging studies  CBC: Recent Labs  Lab 04/17/19 0712 04/17/19 2127 04/18/19 0421 04/19/19 0358 04/20/19 0342  WBC 6.4  --  7.6 6.7 5.8  HGB 12.0  --  10.0* 10.6* 9.4*  HCT 36.2 32.0* 29.1* 31.7* 28.5*  MCV 105.5*  --  104.7* 105.0* 105.9*  PLT 387  --  270 261 175   Basic Metabolic Panel: Recent Labs  Lab 04/17/19 0712 04/17/19 1530 04/17/19 2127 04/18/19 0421 04/19/19 0358 04/20/19 0752  NA 137  --   --  139 137 138  K 3.2*  --   --  3.4* 3.8 3.2*  CL 109  --   --  115* 111 113*  CO2 18*  --   --  15* 17* 19*  GLUCOSE 143*  --   --  47* 69* 130*  BUN 7  --   --  6 <5* <5*  CREATININE 0.87  --   --  0.81 0.70 0.59  CALCIUM 8.6*  --   --  8.2* 8.4* 8.0*  MG  --  1.7 1.6*  --  1.5*  --   PHOS  --  2.8 2.1*  --  2.6  --    GFR: Estimated Creatinine Clearance: 55.3  mL/min (by C-G formula based on SCr of 0.59 mg/dL). Liver Function Tests: Recent Labs  Lab 04/17/19 0712  AST 24  ALT 11  ALKPHOS 95  BILITOT 0.7  PROT 6.8  ALBUMIN 3.1*   Recent Labs  Lab 04/17/19 0712  LIPASE 42   No results for input(s): AMMONIA in the last 168 hours. Coagulation Profile: No results for input(s): INR, PROTIME in the last 168 hours. Cardiac Enzymes: No results for input(s): CKTOTAL, CKMB, CKMBINDEX, TROPONINI in the last 168 hours. BNP (last 3 results) No results for input(s): PROBNP in the last 8760 hours. HbA1C: No results for input(s): HGBA1C in the last 72 hours. CBG: No results for input(s): GLUCAP in the last 168 hours. Lipid  Profile: Recent Labs    04/17/19 1430  CHOL 148  HDL 39*  LDLCALC 92  TRIG 87  CHOLHDL 3.8   Thyroid Function Tests: Recent Labs    04/17/19 1530  TSH 0.869   Anemia Panel: Recent Labs    04/17/19 1530 04/17/19 2127  VITAMINB12 430 449   Urine analysis:    Component Value Date/Time   COLORURINE YELLOW 04/17/2019 1123   APPEARANCEUR HAZY (A) 04/17/2019 1123   LABSPEC 1.014 04/17/2019 1123   PHURINE 6.0 04/17/2019 1123   GLUCOSEU NEGATIVE 04/17/2019 1123   HGBUR NEGATIVE 04/17/2019 1123   BILIRUBINUR NEGATIVE 04/17/2019 1123   KETONESUR 20 (A) 04/17/2019 1123   PROTEINUR NEGATIVE 04/17/2019 1123   NITRITE POSITIVE (A) 04/17/2019 1123   LEUKOCYTESUR TRACE (A) 04/17/2019 1123   Sepsis Labs: @LABRCNTIP (procalcitonin:4,lacticidven:4) ) Recent Results (from the past 240 hour(s))  Urine culture     Status: Abnormal   Collection Time: 04/17/19 11:24 AM   Specimen: Urine, Random  Result Value Ref Range Status   Specimen Description URINE, RANDOM  Final   Special Requests   Final    NONE Performed at [facility], [address] [place], [place] [address]    Culture >=100,000 COLONIES/mL ESCHERICHIA COLI (A)  Final   Report Status 04/19/2019 FINAL  Final   Organism ID, Bacteria ESCHERICHIA COLI  (A)  Final      Susceptibility   Escherichia coli - MIC*    AMPICILLIN >=32 RESISTANT Resistant     CEFAZOLIN 8 SENSITIVE Sensitive     CEFTRIAXONE <=1 SENSITIVE Sensitive     CIPROFLOXACIN <=0.25 SENSITIVE Sensitive     GENTAMICIN >=16 RESISTANT Resistant     IMIPENEM <=0.25 SENSITIVE Sensitive     NITROFURANTOIN <=16 SENSITIVE Sensitive     TRIMETH/SULFA >=320 RESISTANT Resistant     AMPICILLIN/SULBACTAM >=32 RESISTANT Resistant     PIP/TAZO 8 SENSITIVE Sensitive     Extended ESBL NEGATIVE Sensitive     * >=100,000 COLONIES/mL ESCHERICHIA COLI  SARS CORONAVIRUS 2 (TAT 6-24 HRS) Nasopharyngeal Nasopharyngeal Swab     Status: None   Collection Time: 04/17/19  1:52 PM   Specimen: Nasopharyngeal Swab  Result Value Ref Range Status   SARS Coronavirus 2 NEGATIVE NEGATIVE Final    Comment: (NOTE) SARS-CoV-2 target nucleic acids are NOT DETECTED. The SARS-CoV-2 RNA is generally detectable in upper and lower respiratory specimens during the acute phase of infection. Negative results do not preclude SARS-CoV-2 infection, do not rule out co-infections with other pathogens, and should not be used as the sole basis for treatment or other patient management decisions. Negative results must be combined with clinical observations, patient history, and epidemiological information. The expected result is Negative. Fact Sheet for Patients: HairSlick.nohttps://www.fda.gov/media/138098/download Fact Sheet for Healthcare Providers: quierodirigir.comhttps://www.fda.gov/media/138095/download This test is not yet approved or cleared by the [place] FDA and  has been authorized for detection and/or diagnosis of SARS-CoV-2 by FDA under an Emergency Use Authorization (EUA). This EUA will remain  in effect (meaning this test can be used) for the duration of the COVID-19 declaration under Section 56 4(b)(1) of the Act, 21 U.S.C. section 360bbb-3(b)(1), unless the authorization is terminated or revoked sooner. Performed at  [facility], [address] [place], [place] [address]          Radiology Studies: No results found.    Scheduled Meds: . amLODipine  10 mg Oral Daily  . feeding supplement  1 Container Oral TID BM  .  lipase/protease/amylase  24,000 Units Oral TID AC  . multivitamin with minerals  1 tablet Oral Daily  . pantoprazole  40 mg Oral BID AC  . sodium chloride flush  3 mL Intravenous Once   Continuous Infusions: . sodium chloride 125 mL/hr at 04/19/19 1300  . heparin 850 Units/hr (04/20/19 0827)     LOS: 2 days      Calvert Cantor, MD [facility] Pager: [email or web address] Password [email or web address] 04/20/2019, 10:39 AM

## 2019-04-20 NOTE — Progress Notes (Signed)
ANTICOAGULATION CONSULT NOTE - Follow-Up  Pharmacy Consult for heparin Indication: SMV thrombosis  Allergies  Allergen Reactions  . Codeine Other (See Comments)    Cause dizziness    Patient Measurements: Height: 4\' 11"  (149.9 cm) Weight: 91 lb 11.4 oz (41.6 kg) IBW/kg (Calculated) : 43.2 Heparin Dosing Weight: 41.6kg   Vital Signs: Temp: 98.2 F (36.8 C) (10/03 1641) Temp Source: Oral (10/03 1641) BP: 134/91 (10/03 1641) Pulse Rate: 80 (10/03 1641)  Labs: Recent Labs    04/18/19 0421  04/19/19 0358 04/20/19 0342 04/20/19 0752 04/20/19 1357 04/20/19 2109  HGB 10.0*  --  10.6* 9.4*  --   --   --   HCT 29.1*  --  31.7* 28.5*  --   --   --   PLT 270  --  261 235  --   --   --   HEPARINUNFRC 0.31   < > 0.30 0.26*  --  0.22* 0.17*  CREATININE 0.81  --  0.70  --  0.59  --   --    < > = values in this interval not displayed.    Estimated Creatinine Clearance: 55.3 mL/min (by C-G formula based on SCr of 0.59 mg/dL).   Assessment: 48 yo F presented to the ED with abdominal pain. 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 Started on IV heparin. Hbg 9.4/Hct 28.5 (stable, low), plts wnl. Heparin level remain subtherapeutic  Goal of Therapy:  Heparin level 0.3-0.7 units/ml Monitor platelets by anticoagulation protocol: Yes   Plan:  Increase heparin gtt to 1050 units/hr Daily heparin level and CBC  Thanks for allowing pharmacy to be a part of this patient's care.  Excell Seltzer, PharmD Clinical Pharmacist  04/20/2019 10:15 PM

## 2019-04-20 NOTE — Progress Notes (Addendum)
ANTICOAGULATION CONSULT NOTE - Follow-Up  Pharmacy Consult for heparin Indication: SMV thrombosis  Allergies  Allergen Reactions  . Codeine Other (See Comments)    Cause dizziness    Patient Measurements: Height: 4\' 11"  (149.9 cm) Weight: 91 lb 11.4 oz (41.6 kg) IBW/kg (Calculated) : 43.2 Heparin Dosing Weight: 41.6kg   Vital Signs: Temp: 98.4 F (36.9 C) (10/03 0508) Temp Source: Oral (10/03 0508) BP: 131/82 (10/03 0508) Pulse Rate: 71 (10/03 0508)  Labs: Recent Labs    04/18/19 0421 04/18/19 1447 04/19/19 0358 04/20/19 0342  HGB 10.0*  --  10.6* 9.4*  HCT 29.1*  --  31.7* 28.5*  PLT 270  --  261 235  HEPARINUNFRC 0.31 0.44 0.30 0.26*  CREATININE 0.81  --  0.70  --     Estimated Creatinine Clearance: 55.3 mL/min (by C-G formula based on SCr of 0.7 mg/dL).   Assessment: 48 yo F presented to the ED with abdominal pain. 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 Started on IV heparin. Hbg 9.4/Hct 28.5 (stable, low), plts wnl. Heparin level 0.26 (sub-therapeutic); RN reports no issues with lines and no concerns for bleeding.   Goal of Therapy:  Heparin level 0.3-0.7 units/ml Monitor platelets by anticoagulation protocol: Yes   Plan:  Increase heparin gtt to 950 units/hr Confirmation heparin level ordered for 2:30pm Daily heparin level and CBC  Acey Lav, PharmD  PGY1 Acute Care Pharmacy Resident [phone]  1400 Heparin level: 0.22 (sub-therapeutic). bg 9.4/Hct 28.5 (stable, low), plts wnl.RN reports no issues with lines and no concerns for bleeding.   Plan:  Increase heparin gtt to 950 units/hr Confirmation heparin level ordered for 6 hrs Daily heparin level and CBC   04/20/2019 8:15 AM

## 2019-04-21 LAB — BASIC METABOLIC PANEL
Anion gap: 7 (ref 5–15)
BUN: <5 mg/dL — ABNORMAL LOW (ref 6–20)
CO2: 19 mmol/L — ABNORMAL LOW (ref 22–32)
Calcium: 8.5 mg/dL — ABNORMAL LOW (ref 8.9–10.3)
Chloride: 113 mmol/L — ABNORMAL HIGH (ref 98–111)
Creatinine, Ser: 0.63 mg/dL (ref 0.44–1.00)
GFR calc Af Amer: >60 mL/min (ref >60)
GFR calc non Af Amer: >60 mL/min (ref >60)
Glucose, Bld: 168 mg/dL — ABNORMAL HIGH (ref 70–99)
Potassium: 4.4 mmol/L (ref 3.5–5.1)
Sodium: 139 mmol/L (ref 135–145)

## 2019-04-21 LAB — CBC
HCT: 29.1 % — ABNORMAL LOW (ref 36.0–46.0)
Hemoglobin: 9.5 g/dL — ABNORMAL LOW (ref 12.0–15.0)
MCH: 34.2 pg — ABNORMAL HIGH (ref 26.0–34.0)
MCHC: 32.6 g/dL (ref 30.0–36.0)
MCV: 104.7 fL — ABNORMAL HIGH (ref 80.0–100.0)
Platelets: 232 K/uL (ref 150–400)
RBC: 2.78 MIL/uL — ABNORMAL LOW (ref 3.87–5.11)
RDW: 14 % (ref 11.5–15.5)
WBC: 8.6 K/uL (ref 4.0–10.5)
nRBC: 0 % (ref 0.0–0.2)

## 2019-04-21 LAB — HEPARIN LEVEL (UNFRACTIONATED): Heparin Unfractionated: 0.46 [IU]/mL (ref 0.30–0.70)

## 2019-04-21 LAB — ANTIPHOSPHOLIPID SYNDROME EVAL, BLD
Anticardiolipin IgA: <9 APL U/mL (ref 0–11)
Anticardiolipin IgG: <9 GPL U/mL (ref 0–14)
Anticardiolipin IgM: 12 MPL U/mL (ref 0–12)
DRVVT: 43.1 s (ref 0.0–47.0)
PTT Lupus Anticoagulant: 38.9 s (ref 0.0–51.9)
Phosphatydalserine, IgA: 5 APS IgA (ref 0–20)
Phosphatydalserine, IgG: 2 GPS IgG (ref 0–11)
Phosphatydalserine, IgM: 15 MPS IgM (ref 0–25)

## 2019-04-21 LAB — PROTEIN C ACTIVITY: Protein C Activity: 94 % (ref 73–180)

## 2019-04-21 LAB — PROTEIN S ACTIVITY: Protein S Activity: 96 % (ref 63–140)

## 2019-04-21 LAB — MAGNESIUM: Magnesium: 1.4 mg/dL — ABNORMAL LOW (ref 1.7–2.4)

## 2019-04-21 LAB — FOLATE: Folate: 14.2 ng/mL (ref >5.9)

## 2019-04-21 LAB — FACTOR 8 ASSAY: Coagulation Factor VIII: 174 % — ABNORMAL HIGH (ref 56–140)

## 2019-04-21 LAB — PHOSPHORUS: Phosphorus: 3.4 mg/dL (ref 2.5–4.6)

## 2019-04-21 MED ORDER — MAGNESIUM SULFATE 4 GM/100ML IV SOLN
4.0000 g | Freq: Once | INTRAVENOUS | Status: AC
  Administered 2019-04-21: 4 g via INTRAVENOUS
  Filled 2019-04-21: qty 100

## 2019-04-21 MED ORDER — APIXABAN 5 MG PO TABS
5.0000 mg | ORAL_TABLET | Freq: Once | ORAL | Status: AC
  Administered 2019-04-21: 5 mg via ORAL
  Filled 2019-04-21: qty 1

## 2019-04-21 MED ORDER — APIXABAN 5 MG PO TABS
10.0000 mg | ORAL_TABLET | Freq: Two times a day (BID) | ORAL | Status: DC
  Administered 2019-04-21 – 2019-04-25 (×8): 10 mg via ORAL
  Filled 2019-04-21 (×8): qty 2

## 2019-04-21 MED ORDER — SODIUM CHLORIDE 0.9 % IV SOLN
INTRAVENOUS | Status: DC
  Administered 2019-04-21 – 2019-04-23 (×7): via INTRAVENOUS

## 2019-04-21 MED ORDER — APIXABAN 5 MG PO TABS
5.0000 mg | ORAL_TABLET | Freq: Two times a day (BID) | ORAL | Status: DC
  Administered 2019-04-21: 5 mg via ORAL
  Filled 2019-04-21: qty 1

## 2019-04-21 MED ORDER — DICLOFENAC SODIUM 1 % TD GEL
2.0000 g | Freq: Four times a day (QID) | TRANSDERMAL | Status: DC
  Administered 2019-04-21 – 2019-04-25 (×7): 2 g via TOPICAL
  Filled 2019-04-21: qty 100

## 2019-04-21 MED ORDER — OXYCODONE HCL 5 MG PO TABS
5.0000 mg | ORAL_TABLET | ORAL | Status: DC | PRN
  Administered 2019-04-21 – 2019-04-23 (×8): 5 mg via ORAL
  Filled 2019-04-21 (×8): qty 1

## 2019-04-21 MED ORDER — APIXABAN 5 MG PO TABS: 5.0000 mg | ORAL_TABLET | Freq: Two times a day (BID) | ORAL | Status: DC

## 2019-04-21 NOTE — Plan of Care (Signed)
  Problem: Activity: Goal: Risk for activity intolerance will decrease Outcome: Progressing   

## 2019-04-21 NOTE — Progress Notes (Signed)
There is a hat to collect a stool sample in patient's restroom available for patient to use. Patient has not had a BM yet to collect.    Farley Ly RN

## 2019-04-21 NOTE — Progress Notes (Signed)
PROGRESS NOTE    Sarah Burch   ZOX:[id number]  DOB: Sep 28, 1970  DOA: 04/17/2019 PCP: Patient, No Pcp Per   Brief Narrative:  Sarah Burch  is a 48 y.o. female with medical history significant of chronic pancreatitis, GERD, hypertension, tobacco abuse, marijuana abuse presents to emergency department due to worsening epigastric pain and nausea starting the day before coming to the ED.  Patient reports that her symptoms started last month however got worse yesterday.  She is unable to keep anything down.  Reports 10 out of 10 epigastric pain, radiates to her back, no aggravating factors, relieved with ibuprofen, associated with nausea.   CT scan of the abdomen pelvis in the ED notes the following:  Diffusely edematous pancreas compatible with acute pancreatitis. Lack of enhancement of an atrophic pancreatic body segment, question pancreatic necrosis though this could be acute or old. Scattered calcifications at pancreatic tail, few calcifications at head and uncinate process. Small fluid collection at uncinate process consistent with pseudo cyst, 18 x 14 mm image 30. Additional small less well-defined fluid collection at head/body 8 x 6 mm. Edema of duodenal wall. Infiltrative changes extend to the LEFT medial margin of the gallbladder, surrounding duodenum, and extend into lesser sac.  Subjective: She still has the same abdominal pain. She did vomit dinner last night.   Assessment & Plan:   Principal Problem: Abdominal pain  (A) Acute pancreatitis-  Pseudocysts of pancreas -The patient states that she is abstaining from alcohol-triglycerides not elevated- current medication - her lipase is normal however, CT scan is suggestive of an edematous pancreas with calcifications suggestive of chronic pancreatitis -Continue normal saline at 125 cc an hour - increased frequency of Morphine to Every 3 hrs PRN - Add Oxycodone today to see if she can manage without the Morphine -  MRCP shows  acute pancreatitis with small pseudocysts - cont Pancrealipase today- will try to advance to a soft diet - fecal elastase is pending -  (B) Acute Gastritis and duodenitis >-Duodenum is edematous-this may be secondary to acute pancreatitis but may be acute duodenitis as well -  she also admits to taking 8-10 Ibuprofen daily to help control her abdominal pain at home and thus likely also has a severe gastritis or PUD- cont BID PPI  Active Problems:   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 Valetta Mole recommends IV heparin and if she tolerates this without bleeding, change to DOAC for 3 more months and then to aspirin  - change Heparin to Eliquis today as it does not seem she will need any procedures  Hypokalemia, hypophosphatemia and hypomagnesemia -Replace again today and continue to follow  Metabolic acidosis -Likely secondary to elevated chloride level and possibly ketoacidosis due to being n.p.o. - slightly improved    Essential hypertension -Continue amlodipine    Protein calorie malnutrition-severe Body mass index is 18.52 kg/m. -  started supplements      Marijuana abuse   Tobacco abuse --She is only smoking 3 to 4 cigarettes now- counseled    Asymptomatic bacteriuria -Greater than 100,000 e coli-hold off on treating as she is asymptomatic    Macrocytosis -B12 level is normal-  RBC Folate is 872- - I suspect she may be drinking alcohol even if she denies it   Time spent in minutes: 35 DVT prophylaxis: Heparin infusion & SCDs Code Status: Full code Family Communication:  Disposition  Plan: Home when stable Consultants:   None Procedures:   None Antimicrobials:  Anti-infectives (From admission, onward)   Start     Dose/Rate Route Frequency Ordered Stop   04/18/19 0000  piperacillin-tazobactam (ZOSYN) IVPB 3.375 g  Status:  Discontinued     3.375 g 12.5 mL/hr over 240  Minutes Intravenous Every 8 hours 04/17/19 1525 04/18/19 1131   04/17/19 1500  piperacillin-tazobactam (ZOSYN) IVPB 3.375 g     3.375 g 100 mL/hr over 30 Minutes Intravenous  Once 04/17/19 1447 04/17/19 1646     Objective: Vitals:   04/20/19 1641 04/20/19 2100 04/21/19 0332 04/21/19 0911  BP: (!) 134/91 (!) 156/84 (!) 146/91 (!) 158/89  Pulse: 80 76 95 80  Resp: 18 18 18 18   Temp: 98.2 F (36.8 C) 98.1 F (36.7 C) 97.8 F (36.6 C) 97.9 F (36.6 C)  TempSrc: Oral Oral Oral Oral  SpO2: 98% 99% 100% 98%  Weight:      Height:        Intake/Output Summary (Last 24 hours) at 04/21/2019 1205 Last data filed at 04/21/2019 0600 Gross per 24 hour  Intake 2974.11 ml  Output 200 ml  Net 2774.11 ml   Filed Weights   04/17/19 1331  Weight: 41.6 kg    Examination: General exam: Appears comfortable  HEENT: PERRLA, oral mucosa moist, no sclera icterus or thrush Respiratory system: Clear to auscultation. Respiratory effort normal. Cardiovascular system: S1 & S2 heard,  No murmurs  Gastrointestinal system: Abdomen soft,  Tender in epigastrium, nondistended. Normal bowel sounds   Central nervous system: Alert and oriented. No focal neurological deficits. Extremities: No cyanosis, clubbing or edema Skin: No rashes or ulcers Psychiatry:  depressed, crying     Data Reviewed: I have personally reviewed following labs and imaging studies  CBC: Recent Labs  Lab 04/17/19 0712 04/17/19 2127 04/18/19 0421 04/19/19 0358 04/20/19 0342 04/21/19 0329  WBC 6.4  --  7.6 6.7 5.8 8.6  HGB 12.0  --  10.0* 10.6* 9.4* 9.5*  HCT 36.2 32.0* 29.1* 31.7* 28.5* 29.1*  MCV 105.5*  --  104.7* 105.0* 105.9* 104.7*  PLT 387  --  270 261 235 232   Basic Metabolic Panel: Recent Labs  Lab 04/17/19 0712 04/17/19 1530 04/17/19 2127 04/18/19 0421 04/19/19 0358 04/20/19 0752 04/21/19 0329  NA 137  --   --  139 137 138 139  K 3.2*  --   --  3.4* 3.8 3.2* 4.4  CL 109  --   --  115* 111 113* 113*    CO2 18*  --   --  15* 17* 19* 19*  GLUCOSE 143*  --   --  47* 69* 130* 168*  BUN 7  --   --  6 <5* <5* <5*  CREATININE 0.87  --   --  0.81 0.70 0.59 0.63  CALCIUM 8.6*  --   --  8.2* 8.4* 8.0* 8.5*  MG  --  1.7 1.6*  --  1.5* 1.6* 1.4*  PHOS  --  2.8 2.1*  --  2.6  --  3.4   GFR: Estimated Creatinine Clearance: 55.3 mL/min (by C-G formula based on SCr of 0.63 mg/dL). Liver Function Tests: Recent Labs  Lab 04/17/19 0712  AST 24  ALT 11  ALKPHOS 95  BILITOT 0.7  PROT 6.8  ALBUMIN 3.1*   Recent Labs  Lab 04/17/19 0712  LIPASE 42   No results for input(s): AMMONIA in the last 168 hours. Coagulation Profile:  No results for input(s): INR, PROTIME in the last 168 hours. Cardiac Enzymes: No results for input(s): CKTOTAL, CKMB, CKMBINDEX, TROPONINI in the last 168 hours. BNP (last 3 results) No results for input(s): PROBNP in the last 8760 hours. HbA1C: No results for input(s): HGBA1C in the last 72 hours. CBG: No results for input(s): GLUCAP in the last 168 hours. Lipid Profile: No results for input(s): CHOL, HDL, LDLCALC, TRIG, CHOLHDL, LDLDIRECT in the last 72 hours. Thyroid Function Tests: No results for input(s): TSH, T4TOTAL, FREET4, T3FREE, THYROIDAB in the last 72 hours. Anemia Panel: No results for input(s): VITAMINB12, FOLATE, FERRITIN, TIBC, IRON, RETICCTPCT in the last 72 hours. Urine analysis:    Component Value Date/Time   COLORURINE YELLOW 04/17/2019 1123   APPEARANCEUR HAZY (A) 04/17/2019 1123   LABSPEC 1.014 04/17/2019 1123   PHURINE 6.0 04/17/2019 1123   GLUCOSEU NEGATIVE 04/17/2019 1123   HGBUR NEGATIVE 04/17/2019 1123   BILIRUBINUR NEGATIVE 04/17/2019 1123   KETONESUR 20 (A) 04/17/2019 1123   PROTEINUR NEGATIVE 04/17/2019 1123   NITRITE POSITIVE (A) 04/17/2019 1123   LEUKOCYTESUR TRACE (A) 04/17/2019 1123   Sepsis Labs: @LABRCNTIP (procalcitonin:4,lacticidven:4) ) Recent Results (from the past 240 hour(s))  Urine culture     Status: Abnormal    Collection Time: 04/17/19 11:24 AM   Specimen: Urine, Random  Result Value Ref Range Status   Specimen Description URINE, RANDOM  Final   Special Requests   Final    NONE Performed at [facility], [place] [address], [place], [place] [address]    Culture >=100,000 COLONIES/mL ESCHERICHIA COLI (A)  Final   Report Status 04/19/2019 FINAL  Final   Organism ID, Bacteria ESCHERICHIA COLI (A)  Final      Susceptibility   Escherichia coli - MIC*    AMPICILLIN >=32 RESISTANT Resistant     CEFAZOLIN 8 SENSITIVE Sensitive     CEFTRIAXONE <=1 SENSITIVE Sensitive     CIPROFLOXACIN <=0.25 SENSITIVE Sensitive     GENTAMICIN >=16 RESISTANT Resistant     IMIPENEM <=0.25 SENSITIVE Sensitive     NITROFURANTOIN <=16 SENSITIVE Sensitive     TRIMETH/SULFA >=320 RESISTANT Resistant     AMPICILLIN/SULBACTAM >=32 RESISTANT Resistant     PIP/TAZO 8 SENSITIVE Sensitive     Extended ESBL NEGATIVE Sensitive     * >=100,000 COLONIES/mL ESCHERICHIA COLI  SARS CORONAVIRUS 2 (TAT 6-24 HRS) Nasopharyngeal Nasopharyngeal Swab     Status: None   Collection Time: 04/17/19  1:52 PM   Specimen: Nasopharyngeal Swab  Result Value Ref Range Status   SARS Coronavirus 2 NEGATIVE NEGATIVE Final    Comment: (NOTE) SARS-CoV-2 target nucleic acids are NOT DETECTED. The SARS-CoV-2 RNA is generally detectable in upper and lower respiratory specimens during the acute phase of infection. Negative results do not preclude SARS-CoV-2 infection, do not rule out co-infections with other pathogens, and should not be used as the sole basis for treatment or other patient management decisions. Negative results must be combined with clinical observations, patient history, and epidemiological information. The expected result is Negative. Fact Sheet for Patients: SugarRoll.be Fact Sheet for Healthcare Providers: https://www.woods-mathews.com/ This test is not yet approved or cleared by the  [place] FDA and  has been authorized for detection and/or diagnosis of SARS-CoV-2 by FDA under an Emergency Use Authorization (EUA). This EUA will remain  in effect (meaning this test can be used) for the duration of the COVID-19 declaration under Section 56 4(b)(1) of the Act, 21 U.S.C. section 360bbb-3(b)(1), unless the  authorization is terminated or revoked sooner. Performed at [facility], [address] [place], [place] [address]          Radiology Studies: Mr Abdomen Mrcp Vivien Rossetti Contast  Result Date: 04/20/2019 CLINICAL DATA:  48 year old female with suspected pancreatitis. Upper abdominal pain. EXAM: MRI ABDOMEN WITHOUT AND WITH CONTRAST (INCLUDING MRCP) TECHNIQUE: Multiplanar multisequence MR imaging of the abdomen was performed both before and after the administration of intravenous contrast. Heavily T2-weighted images of the biliary and pancreatic ducts were obtained, and three-dimensional MRCP images were rendered by post processing. CONTRAST:  4mL GADAVIST GADOBUTROL 1 MMOL/ML IV SOLN COMPARISON:  No prior abdominal MRI. CT the abdomen and pelvis 04/17/2019. FINDINGS: Lower chest: Unremarkable. Hepatobiliary: No suspicious cystic or solid hepatic lesions. No intra or extrahepatic biliary ductal dilatation noted on MRCP images. Gallbladder is normal in appearance. Pancreas: Unfortunately, due to the extent of active inflammation, trace amount of peripancreatic fluid, and motion related artifact on today's examination, several of the sequences poorly evaluated the pancreas, including MRCP imaging which is essentially nondiagnostic. With these limitations in mind there are several small pancreatic fluid collections, largest of which is in the posterior aspect of the pancreatic head measuring 1.8 x 1.4 x 2.7 cm (axial image 22 of series 3 and coronal image 20 of series 4) demonstrating mild T1 hyperintensity, T2 hyperintensity and no significant internal enhancement, most  compatible with a mildly hemorrhagic pancreatic pseudocyst. Several additional small nonenhancing areas are also noted in the adjacent pancreatic parenchyma best appreciated on delayed imaging (series 1304), but poorly evaluated on other pulse sequences, presumably additional small pancreatic pseudocysts. One of these collections appears to extend anteriorly and to the right, potentially involving the anterior wall of the second portion of the duodenum (axial image 38 of series 1304). These are predominantly in the head of the pancreas, although there is an additional small lesion in the tail of the pancreas (axial image 22 of series 1304) measuring 1 cm, and there are several small collections which appear to extend into the gastrosplenic ligament best appreciated on coronal image 28 of series 14, all likely to represent small pancreatic pseudocysts. Pancreatic parenchyma otherwise appears to enhance normally. Spleen:  Unremarkable. Adrenals/Urinary Tract: Bilateral kidneys and adrenal glands are normal in appearance. No hydroureteronephrosis in the visualized portions of the abdomen. Stomach/Bowel: Probable extension of one of the pancreatic pseudocysts to involve the anterior wall of the second portion of the duodenum, as discussed above. Vascular/Lymphatic: Aortic atherosclerosis. No aneurysm identified in the visualized abdominal vasculature. Filling defect in the superior mesenteric vein, compatible with nonocclusive thrombus (axial image 33 of series 1302). No lymphadenopathy identified in the abdomen. Other: Trace volume of fluid around the pancreas. Trace volume of ascites. Musculoskeletal: No aggressive appearing osseous lesions are noted in the visualized portions of the skeleton. IMPRESSION: 1. Acute pancreatitis with multiple pancreatic pseudocysts, as detailed above. 2. 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 3. Although portions of the MRCP imaging is considered  nondiagnostic, there is no evidence of intrahepatic or extrahepatic biliary ductal dilatation, and no definite pancreatic ductal dilatation. Electronically Signed   By: Trudie Reed M.D.   On: 04/20/2019 19:27      Scheduled Meds:  amLODipine  10 mg Oral Daily   apixaban  5 mg Oral BID   diclofenac sodium  2 g Topical QID   feeding supplement  1 Container Oral TID BM   lipase/protease/amylase  24,000 Units Oral TID AC  multivitamin with minerals  1 tablet Oral Daily   pantoprazole  40 mg Oral BID AC   sodium chloride flush  3 mL Intravenous Once   Continuous Infusions:  sodium chloride 125 mL/hr at 04/21/19 0949   magnesium sulfate bolus IVPB 4 g (04/21/19 1030)     LOS: 3 days      Calvert Cantor, MD [facility] Pager: [email or web address] Password [email or web address] 04/21/2019, 12:05 PM

## 2019-04-21 NOTE — Progress Notes (Signed)
ANTICOAGULATION CONSULT NOTE - Follow-Up  Pharmacy Consult for heparin Indication: SMV thrombosis  Allergies  Allergen Reactions  . Codeine Other (See Comments)    Cause dizziness    Patient Measurements: Height: 4\' 11"  (149.9 cm) Weight: 91 lb 11.4 oz (41.6 kg) IBW/kg (Calculated) : 43.2 Heparin Dosing Weight: 41.6kg   Vital Signs: Temp: 97.8 Burch (36.6 C) (10/04 0332) Temp Source: Oral (10/04 0332) BP: 146/91 (10/04 0332) Pulse Rate: 95 (10/04 0332)  Labs: Recent Labs    04/19/19 0358 04/20/19 0342 04/20/19 0752 04/20/19 1357 04/20/19 2109 04/21/19 0329  HGB 10.6* 9.4*  --   --   --  9.5*  HCT 31.7* 28.5*  --   --   --  29.1*  PLT 261 235  --   --   --  232  HEPARINUNFRC 0.30 0.26*  --  0.22* 0.17* 0.46  CREATININE 0.70  --  0.59  --   --  0.63    Estimated Creatinine Clearance: 55.3 mL/min (by C-G formula based on SCr of 0.63 mg/dL).   Assessment: 48 yo Burch presented to the ED with abdominal pain. 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 Started on IV heparin. Hbg 9.5/Hct 29.1 (stable, low), plts wnl. Heparin level is therapeutic @ 0.46.  Goal of Therapy:  Heparin level 0.3-0.7 units/ml Monitor platelets by anticoagulation protocol: Yes   Plan:  Continue heparin gtt to 1050 units/hr Obtain confirmatory heparin level (6hrs) Daily heparin level and CBC  Thanks for allowing pharmacy to be a part of this patient's care.  Acey Lav, PharmD  PGY1 Acute Care Pharmacy Resident [phone] 04/21/2019 8:46 AM

## 2019-04-21 NOTE — Plan of Care (Signed)
  Problem: Education: Goal: Knowledge of General Education information will improve Description: Including pain rating scale, medication(s)/side effects and non-pharmacologic comfort measures Outcome: Progressing   Problem: Health Behavior/Discharge Planning: Goal: Ability to manage health-related needs will improve Outcome: Progressing   Problem: Clinical Measurements: Goal: Will remain free from infection Outcome: Progressing   Problem: Activity: Goal: Risk for activity intolerance will decrease Outcome: Progressing   Problem: Coping: Goal: Level of anxiety will decrease Outcome: Progressing   Problem: Elimination: Goal: Will not experience complications related to bowel motility Outcome: Progressing Goal: Will not experience complications related to urinary retention Outcome: Progressing   Problem: Pain Managment: Goal: General experience of comfort will improve Outcome: Progressing   Problem: Safety: Goal: Ability to remain free from injury will improve Outcome: Progressing   Problem: Skin Integrity: Goal: Risk for impaired skin integrity will decrease Outcome: Progressing

## 2019-04-21 NOTE — Progress Notes (Addendum)
ANTICOAGULATION CONSULT NOTE - Follow-Up  Pharmacy Consult for transition from heparin to apixaban  Indication: SMV thrombosis  Allergies  Allergen Reactions  . Codeine Other (See Comments)    Cause dizziness    Patient Measurements: Height: 4\' 11"  (149.9 cm) Weight: 91 lb 11.4 oz (41.6 kg) IBW/kg (Calculated) : 43.2 Heparin Dosing Weight: 41.6kg   Vital Signs: Temp: 97.9 F (36.6 C) (10/04 0911) Temp Source: Oral (10/04 0911) BP: 158/89 (10/04 0911) Pulse Rate: 80 (10/04 0911)  Labs: Recent Labs    04/19/19 0358 04/20/19 0342 04/20/19 0752 04/20/19 1357 04/20/19 2109 04/21/19 0329  HGB 10.6* 9.4*  --   --   --  9.5*  HCT 31.7* 28.5*  --   --   --  29.1*  PLT 261 235  --   --   --  232  HEPARINUNFRC 0.30 0.26*  --  0.22* 0.17* 0.46  CREATININE 0.70  --  0.59  --   --  0.63    Estimated Creatinine Clearance: 55.3 mL/min (by C-G formula based on SCr of 0.63 mg/dL).   Assessment: 48 yo F presented to the ED with abdominal pain. 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 Started on IV heparin. Hbg 9.5/Hct 29.1 (stable, low), plts wnl. Heparin level was therapeutic @ 0.46. Now transitioning from heparin to apixaban. No PTA AC, new start apixaban.   Goal of Therapy:  Heparin level 0.3-0.7 units/ml Monitor platelets by anticoagulation protocol: Yes   Plan:  Discontinue heparin gtt to 1050 units/hr Start apixaban 10 mg twice daily for 7 days followed by 5 mg twice daily Monitor s/sx of bleeding   Thanks for allowing pharmacy to be a part of this patient's care.  Acey Lav, PharmD  PGY1 Acute Care Pharmacy Resident [phone] 04/21/2019 10:44 AM

## 2019-04-22 DIAGNOSIS — K859 Acute pancreatitis without necrosis or infection, unspecified: Principal | ICD-10-CM

## 2019-04-22 DIAGNOSIS — K858 Other acute pancreatitis without necrosis or infection: Secondary | ICD-10-CM

## 2019-04-22 DIAGNOSIS — K861 Other chronic pancreatitis: Secondary | ICD-10-CM

## 2019-04-22 LAB — FOLATE RBC
Folate, Hemolysate: 284 ng/mL
Folate, RBC: 928 ng/mL (ref >498)
Hematocrit: 30.6 % — ABNORMAL LOW (ref 34.0–46.6)

## 2019-04-22 LAB — MAGNESIUM: Magnesium: 1.6 mg/dL — ABNORMAL LOW (ref 1.7–2.4)

## 2019-04-22 LAB — BASIC METABOLIC PANEL
Anion gap: 7 (ref 5–15)
BUN: <5 mg/dL — ABNORMAL LOW (ref 6–20)
CO2: 23 mmol/L (ref 22–32)
Calcium: 8.4 mg/dL — ABNORMAL LOW (ref 8.9–10.3)
Chloride: 110 mmol/L (ref 98–111)
Creatinine, Ser: 0.63 mg/dL (ref 0.44–1.00)
GFR calc Af Amer: >60 mL/min (ref >60)
GFR calc non Af Amer: >60 mL/min (ref >60)
Glucose, Bld: 133 mg/dL — ABNORMAL HIGH (ref 70–99)
Potassium: 3.4 mmol/L — ABNORMAL LOW (ref 3.5–5.1)
Sodium: 140 mmol/L (ref 135–145)

## 2019-04-22 LAB — CBC
HCT: 28.1 % — ABNORMAL LOW (ref 36.0–46.0)
Hemoglobin: 9.4 g/dL — ABNORMAL LOW (ref 12.0–15.0)
MCH: 34.6 pg — ABNORMAL HIGH (ref 26.0–34.0)
MCHC: 33.5 g/dL (ref 30.0–36.0)
MCV: 103.3 fL — ABNORMAL HIGH (ref 80.0–100.0)
Platelets: 247 10*3/uL (ref 150–400)
RBC: 2.72 MIL/uL — ABNORMAL LOW (ref 3.87–5.11)
RDW: 13.8 % (ref 11.5–15.5)
WBC: 8.6 10*3/uL (ref 4.0–10.5)
nRBC: 0 % (ref 0.0–0.2)

## 2019-04-22 LAB — PHOSPHORUS: Phosphorus: 3 mg/dL (ref 2.5–4.6)

## 2019-04-22 MED ORDER — MAGNESIUM SULFATE 4 GM/100ML IV SOLN
4.0000 g | Freq: Once | INTRAVENOUS | Status: AC
  Administered 2019-04-22: 4 g via INTRAVENOUS
  Filled 2019-04-22: qty 100

## 2019-04-22 MED ORDER — POTASSIUM CHLORIDE CRYS ER 20 MEQ PO TBCR
40.0000 meq | EXTENDED_RELEASE_TABLET | Freq: Once | ORAL | Status: AC
  Administered 2019-04-22: 40 meq via ORAL
  Filled 2019-04-22: qty 2

## 2019-04-22 NOTE — Consult Note (Addendum)
[facility] Consult: 2:52 PM 04/22/2019  LOS: 4 days    Referring Provider: Dr. Wynelle Cleveland Primary Care Physician:  Patient, No Pcp Per Primary Gastroenterologist:  Althia Forts.  Previous GI involvement or endoscopies.   Reason for Consultation: Pancreatitis.   HPI: Sarah Burch is a 48 y.o. female.  PMH hypertension.  GERD.  Chronic pancreatitis.  S/p Left shoulder replacement.  Lives in [place].  At least 5 years of intermittent episodes of epigastric pain, nausea, vomiting.  She never sought hospital or medical attention for this.  She would restrict herself to liquid diet and the symptoms would resolve in about 3 days.  16 years ago, for about a year she drank heavily but went to rehab and since then consumes maybe 2 or 3 twelve ounce beers per month. Developed especially acute symptoms in July at which time she was admitted with acute pancreatitis (Lipase 207, AST/ALT 260/119, normal T bili and alk phos) and hypertensive urgency in 01/31/2019.  Triglycerides not elevated.      Ultrasound showed a small amount of sludge in the gallbladder, normal liver, not clear the pancreas was well imaged as there is no mention of the pancreas on the ultrasound.   No GI consult was called and she was not referred to a gastroenterologist or a Psychologist, sport and exercise.  She was discharged on Norvasc.     Since discharge she has been having chronic epigastric pain sometimes it radiates to the back.  She is chronically nauseated sometimes throws up but it is bilious.  She has not been able to eat much and she is lost about 10 or 12 pounds since July.  Taking 400 mg ibuprofen every 3 or 4 hours for several weeks.  No melena, no hematemesis.  Not taking any GI protective medications.  Return to the ED on 04/17/2019 for worsening epigastric  pain radiating to the back, bilious nausea vomiting..  Contrast CTAP showed changes of acute on chronic calcific pancreatitis with significant peripancreatic edema.  There were 2 small fluid collections, 18 x 14 mm at the uncinate process and 8 x 6 mm at the head/body junction consistent with pseudocysts.  There was an atrophic segment in the pancreatic body with absent enhancement concerning for pancreatic necrosis but could be sequela of prior pancreatitis.  Small SMV thrombus. Unremarkable infrequent consumption of alcohol. Lipase 42, LFTs normal.  ANA negative  MRCP 10/3 reveals acute pancreatitis with multiple pancreatic pseudocysts.  Nonocclusive SMV thrombus.  No evidence for intra-or extrahepatic biliary ductal dilatation and no definite pancreatic ductal dilatation.  No gallbladder sludge or stones. Regarding labs, there is been no assay of her LFTs or lipase.  Hgb 9.4 (13.4 on 7/11) with MCV 105.  Folate, 12 within normal limits.  Family history of pancreatitis.  Her mother died with post viral cardiomyopathy.    Past Medical History:  Diagnosis Date   Chronic GERD    Hypertension    Marijuana abuse    Pancreatitis    Tobacco abuse     History reviewed. No pertinent surgical history.  Prior to Admission medications   Medication Sig Start Date End Date Taking? Authorizing Provider  acetaminophen (TYLENOL) 325 MG tablet Take 650 mg by mouth every 6 (six) hours as needed for headache (pain).   Yes [provider]  amLODipine (NORVASC) 10 MG tablet Take 1 tablet (10 mg total) by mouth daily. 02/06/19 06/06/19 Yes McClung, Dionne Bucy, PA-C  omeprazole (PRILOSEC) 20 MG capsule Take 1 capsule (20 mg total) by mouth daily. 01/31/19  Yes Nita Sells, MD  ondansetron (ZOFRAN) 8 MG tablet Take 1 tablet (8 mg total) by mouth every 8 (eight) hours as needed for nausea or vomiting. Patient not taking: Reported on 04/17/2019 02/06/19   Argentina Donovan, PA-C    Scheduled  Meds:  amLODipine  10 mg Oral Daily   apixaban  10 mg Oral BID   Followed by   Derrill Memo ON 04/28/2019] apixaban  5 mg Oral BID   diclofenac sodium  2 g Topical QID   feeding supplement  1 Container Oral TID BM   lipase/protease/amylase  24,000 Units Oral TID AC   multivitamin with minerals  1 tablet Oral Daily   pantoprazole  40 mg Oral BID AC   sodium chloride flush  3 mL Intravenous Once   Infusions:  sodium chloride 125 mL/hr at 04/22/19 1020   PRN Meds: acetaminophen **OR** acetaminophen, hydrALAZINE, morphine injection, ondansetron **OR** ondansetron (ZOFRAN) IV, oxyCODONE   Allergies as of 04/17/2019 - Review Complete 04/17/2019  Allergen Reaction Noted   Codeine Other (See Comments) 01/25/2019    History reviewed. No pertinent family history.  Social History   Socioeconomic History   Marital status: Divorced    Spouse name: Not on file   Number of children: Not on file   Years of education: Not on file   Highest education level: Not on file  Occupational History   Not on file  Social Needs   Financial resource strain: Not on file   Food insecurity    Worry: Not on file    Inability: Not on file   Transportation needs    Medical: Not on file    Non-medical: Not on file  Tobacco Use   Smoking status: Current Every Day Smoker   Smokeless tobacco: Never Used  Substance and Sexual Activity   Alcohol use: Yes   Drug use: Never   Sexual activity: Not on file  Lifestyle   Physical activity    Days per week: Not on file    Minutes per session: Not on file   Stress: Not on file  Relationships   Social connections    Talks on phone: Not on file    Gets together: Not on file    Attends religious service: Not on file    Active member of club or organization: Not on file    Attends meetings of clubs or organizations: Not on file    Relationship status: Not on file   Intimate partner violence    Fear of current or ex partner: Not on  file    Emotionally abused: Not on file    Physically abused: Not on file    Forced sexual activity: Not on file  Other Topics Concern   Not on file  Social History Narrative   Not on file    REVIEW OF SYSTEMS: Constitutional: Feels tired, weak. ENT:  No nose bleeds Pulm: Mostly nonproductive cough. CV:  No palpitations, no LE edema.  No chest pain, no angina. GU:  No hematuria,  no frequency GI: See HPI. Heme: Excessive or unusual bleeding or bruising. Transfusions: None. Neuro:  No headaches, no peripheral tingling or numbness.  Seizures, no syncope. Derm:  No itching, no rash or sores.  Endocrine:  No sweats or chills.  No polyuria or dysuria Immunization: None in the record. Travel:  None beyond local counties in last few months.    PHYSICAL EXAM: Vital signs in last 24 hours: Vitals:   04/22/19 0450 04/22/19 0813  BP: (!) 142/91 (!) 173/99  Pulse: 92 93  Resp: 18 18  Temp: 98.2 F (36.8 C) (!) 97.5 F (36.4 C)  SpO2: 95% 98%   Wt Readings from Last 3 Encounters:  04/17/19 41.6 kg  01/25/19 41.6 kg    General: Thin, somewhat cachectic looking WF.  Sitting up comfortable in bed.  Looks old for age. Head: No facial asymmetry or swelling.  No signs of head trauma. Eyes: No scleral icterus, no conjunctival pallor.  EOMI. Ears: Not hard of hearing Nose: No discharge or congestion Mouth: Moist, clear, pink oral mucosa.  Tongue midline.  Poor dentition Neck: No JVD, masses, thyromegaly. Lungs: Clear bilaterally.  No labored breathing or cough.  Vocal quality hoarse. Heart: RRR.  No MRG.  S1, S2 present. Abdomen: Soft.  Not distended.  No HSM, masses, bruits, hernias.  Mild to moderate tenderness in the epigastrium.  No guarding or rebound..   Rectal: Deferred Musc/Skeltl: No joint swelling or redness. Extremities: No CCE. Neurologic: Fully alert and oriented.  Good historian.  No limb weakness. Skin: No suspicious lesions or sores. Nodes: No cervical  adenopathy. Psych: Pleasant, cooperative, calm.  Intake/Output from previous day: 10/04 0701 - 10/05 0700 In: 3005.8 [P.O.:660; I.V.:2245.8; IV Piggyback:100] Out: 300 [Urine:300] Intake/Output this shift: Total I/O In: 730 [P.O.:480; I.V.:250] Out: 0   LAB RESULTS: Recent Labs    04/20/19 0342 04/21/19 0329 04/22/19 0529  WBC 5.8 8.6 8.6  HGB 9.4* 9.5* 9.4*  HCT 28.5* 29.1* 28.1*  PLT 235 232 247   BMET Lab Results  Component Value Date   NA 140 04/22/2019   NA 139 04/21/2019   NA 138 04/20/2019   K 3.4 (L) 04/22/2019   K 4.4 04/21/2019   K 3.2 (L) 04/20/2019   CL 110 04/22/2019   CL 113 (H) 04/21/2019   CL 113 (H) 04/20/2019   CO2 23 04/22/2019   CO2 19 (L) 04/21/2019   CO2 19 (L) 04/20/2019   GLUCOSE 133 (H) 04/22/2019   GLUCOSE 168 (H) 04/21/2019   GLUCOSE 130 (H) 04/20/2019   BUN <5 (L) 04/22/2019   BUN <5 (L) 04/21/2019   BUN <5 (L) 04/20/2019   CREATININE 0.63 04/22/2019   CREATININE 0.63 04/21/2019   CREATININE 0.59 04/20/2019   CALCIUM 8.4 (L) 04/22/2019   CALCIUM 8.5 (L) 04/21/2019   CALCIUM 8.0 (L) 04/20/2019   LFT No results for input(s): PROT, ALBUMIN, AST, ALT, ALKPHOS, BILITOT, BILIDIR, IBILI in the last 72 hours. PT/INR No results found for: INR, PROTIME Hepatitis Panel No results for input(s): HEPBSAG, HCVAB, HEPAIGM, HEPBIGM in the last 72 hours. C-Diff No components found for: CDIFF Lipase     Component Value Date/Time   LIPASE 42 04/17/2019 0712    Drugs of Abuse     Component Value Date/Time   LABOPIA POSITIVE (A) 04/17/2019 1123   COCAINSCRNUR NONE DETECTED 04/17/2019 1123   LABBENZ NONE DETECTED 04/17/2019 1123   AMPHETMU NONE DETECTED 04/17/2019 1123   THCU POSITIVE (A) 04/17/2019 1123  LABBARB NONE DETECTED 04/17/2019 1123     RADIOLOGY STUDIES: Mr Abdomen Mrcp Moise Boring Contast  Result Date: 04/20/2019 CLINICAL DATA:  48 year old female with suspected pancreatitis. Upper abdominal pain. EXAM: MRI ABDOMEN WITHOUT AND  WITH CONTRAST (INCLUDING MRCP) TECHNIQUE: Multiplanar multisequence MR imaging of the abdomen was performed both before and after the administration of intravenous contrast. Heavily T2-weighted images of the biliary and pancreatic ducts were obtained, and three-dimensional MRCP images were rendered by post processing. CONTRAST:  22m GADAVIST GADOBUTROL 1 MMOL/ML IV SOLN COMPARISON:  No prior abdominal MRI. CT the abdomen and pelvis 04/17/2019. FINDINGS: Lower chest: Unremarkable. Hepatobiliary: No suspicious cystic or solid hepatic lesions. No intra or extrahepatic biliary ductal dilatation noted on MRCP images. Gallbladder is normal in appearance. Pancreas: Unfortunately, due to the extent of active inflammation, trace amount of peripancreatic fluid, and motion related artifact on today's examination, several of the sequences poorly evaluated the pancreas, including MRCP imaging which is essentially nondiagnostic. With these limitations in mind there are several small pancreatic fluid collections, largest of which is in the posterior aspect of the pancreatic head measuring 1.8 x 1.4 x 2.7 cm (axial image 22 of series 3 and coronal image 20 of series 4) demonstrating mild T1 hyperintensity, T2 hyperintensity and no significant internal enhancement, most compatible with a mildly hemorrhagic pancreatic pseudocyst. Several additional small nonenhancing areas are also noted in the adjacent pancreatic parenchyma best appreciated on delayed imaging (series 1304), but poorly evaluated on other pulse sequences, presumably additional small pancreatic pseudocysts. One of these collections appears to extend anteriorly and to the right, potentially involving the anterior wall of the second portion of the duodenum (axial image 38 of series 1304). These are predominantly in the head of the pancreas, although there is an additional small lesion in the tail of the pancreas (axial image 22 of series 1304) measuring 1 cm, and there  are several small collections which appear to extend into the gastrosplenic ligament best appreciated on coronal image 28 of series 14, all likely to represent small pancreatic pseudocysts. Pancreatic parenchyma otherwise appears to enhance normally. Spleen:  Unremarkable. Adrenals/Urinary Tract: Bilateral kidneys and adrenal glands are normal in appearance. No hydroureteronephrosis in the visualized portions of the abdomen. Stomach/Bowel: Probable extension of one of the pancreatic pseudocysts to involve the anterior wall of the second portion of the duodenum, as discussed above. Vascular/Lymphatic: Aortic atherosclerosis. No aneurysm identified in the visualized abdominal vasculature. Filling defect in the superior mesenteric vein, compatible with nonocclusive thrombus (axial image 33 of series 1302). No lymphadenopathy identified in the abdomen. Other: Trace volume of fluid around the pancreas. Trace volume of ascites. Musculoskeletal: No aggressive appearing osseous lesions are noted in the visualized portions of the skeleton. IMPRESSION: 1. Acute pancreatitis with multiple pancreatic pseudocysts, as detailed above. 2. 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 3. Although portions of the MRCP imaging is considered nondiagnostic, there is no evidence of intrahepatic or extrahepatic biliary ductal dilatation, and no definite pancreatic ductal dilatation. Electronically Signed   By: DVinnie LangtonM.D.   On: 04/20/2019 19:27      IMPRESSION:   *   Acute on chronic pancreatitis with pseudocysts.  Initial diagnosis of pancreatitis made 01/2019 but had symptoms suggestive of pancreatitis periodically over the previous 5 years.  Etiology of pancreatitis unclear.  Could be alcohol though she, per her report drinks moderately, a couple of beers per month.  Ultrasound in July showed gallstones but no gallbladder issues or ductal issues  seen on current MRCP or CT  *     Macrocytic  anemia.    Macrocytosis is not new but the anemia is new.  *      Heavy/excessive use of ibuprofen over the last several weeks.  Fortunately her renal function has not suffered.  Suggestion of duodenitis per imaging, could be from her ibuprofen or secondary to the pancreatitis    PLAN:     *    Per Dr Ethlyn Daniels  04/22/2019, 2:52 PM Phone [phone]   Attending physician's note   I have taken an interval history, reviewed the chart and examined the patient. I agree with the Advanced Practitioner's note, impression and recommendations. CT, MRI reviewed independently.  Acute on chronic calcific pancreatitis, likely d/t ETOH/smoking, with small pseudocysts (too small and immature to be drained). No PD dilatation. SMV thrombosis.  No mesenteric ischemia.  Plan: -Pain control as already being done. -Full liquid diet.  Advance to low-fat diet as tolerated.  -Continue boost supplements. -Heparin has been switched to Eliquis.  Will need Eliquis for 3 months. -Stop alcohol/smoking. -Protonix 40 mg p.o. BID -ERCP/EUS would not be helpful at this time. -Check IgG4 (r/o autoimmune pancreatitis), CA19-9. -Would need CT pancreatic protocol in 12 weeks as an outpatient to assess SMV status, pseudocysts. -Would need follow-up thereafter. -Not having any exocrine pancreatic insufficiency symptoms.  She has been on pancreatic enzymes.  Can continue once she is able to tolerate p.o.  Carmell Austria, MD [facility] [phone].

## 2019-04-22 NOTE — TOC Initial Note (Signed)
Transition of Care [facility]) - Initial/Assessment Note    Patient Details  Name: Sarah Burch MRN: [id number] Date of Birth: 1970/09/03  Transition of [facility]) CM/SW Contact:    Bartholomew Crews, RN Phone Number: [phone] 04/22/2019, 4:17 PM  Clinical Narrative:                 Spoke with patient at the bedside. Permission granted to speak in front of spouse. PTA independent but stated that the pain has been ongoing since last admission. No HH. No DME. Patient states that she is getting up out of bed without difficulty despite pain.   Discussed no insurance. No PCP. Stated that she had not been able to schedule appointment with [facility] for blood work after having a telehealth appointment. Patient agreed to establishing at [place] - appointment scheduled for 10/21 2:30pm.   Discussed medication needs. Eliquis can be filled by pharmacy for first 30 days for free - patient agreeable to using [facility] pharmacy. Please note if patient discharges on weekend, have medications filled on Friday. Valentino Hue information provided to patient to follow up with patient assistance. Noted patient on creon - not covered by [employer] d/t high cost greater than $700. Noted GoodRx $1500. Provided patient with creon patient assistance application. Encouraged to complete and take to hospital f/u appointment. Patient expressed appreciation for information.   Following for transition of care needs.   Expected Discharge Plan: Home/Self Care Barriers to Discharge: Continued Medical Work up   Patient Goals and CMS Choice Patient states their goals for this hospitalization and ongoing recovery are:: feel better and not hurt      Expected Discharge Plan and Services Expected Discharge Plan: Home/Self Care In-house Referral: Development worker, community, Clinical Social Work Discharge Planning Services: CM Consult Post Acute Care Choice: NA Living arrangements for the past 2 months: Single Family  Home                 DME Arranged: N/A DME Agency: NA       HH Arranged: NA HH Agency: NA        Prior Living Arrangements/Services Living arrangements for the past 2 months: Single Family Home Lives with:: Self, Spouse Patient language and need for interpreter reviewed:: Yes Do you feel safe going back to the place where you live?: Yes            Criminal Activity/Legal Involvement Pertinent to Current Situation/Hospitalization: No - Comment as needed  Activities of Daily Living Home Assistive Devices/Equipment: None ADL Screening (condition at time of admission) Patient's cognitive ability adequate to safely complete daily activities?: Yes Is the patient deaf or have difficulty hearing?: No Does the patient have difficulty seeing, even when wearing glasses/contacts?: No Does the patient have difficulty concentrating, remembering, or making decisions?: No Patient able to express need for assistance with ADLs?: No Does the patient have difficulty dressing or bathing?: No Independently performs ADLs?: Yes (appropriate for developmental age) Does the patient have difficulty walking or climbing stairs?: No Weakness of Legs: None Weakness of Arms/Hands: None  Permission Sought/Granted Permission sought to share information with : Family Supports Permission granted to share information with : Yes, Verbal Permission Granted  Share Information with NAME: Wrenly Lauritsen     Permission granted to share info w Relationship: spouse  Permission granted to share info w Contact Information: [phone]  Emotional Assessment Appearance:: Appears older than stated age Attitude/Demeanor/Rapport: Engaged Affect (typically observed): Accepting Orientation: : Oriented to Situation,  Oriented to  Time, Oriented to Place, Oriented to Self Alcohol / Substance Use: Alcohol Use Psych Involvement: No (comment)  Admission diagnosis:  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 Protein-calorie malnutrition, severe 04/19/2019  . Hypokalemia 04/17/2019  . Essential hypertension 04/17/2019  . Protein calorie malnutrition (HCC) 04/17/2019  . Marijuana abuse 04/17/2019  . Tobacco abuse 04/17/2019  . Asymptomatic bacteriuria 04/17/2019  . Macrocytosis 04/17/2019  . 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 Hypoalbuminemia 04/17/2019  . 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 Acute pancreatitis 01/26/2019  . GERD (gastroesophageal reflux disease) 01/26/2019  . Acute on chronic pancreatitis (HCC) 01/26/2019   PCP:  Patient, No Pcp Per Pharmacy:   [facility] [phone] - [place], [place] - [address] AT [facility] [address] [place] [address] Phone: [phone] Fax: [phone]  [facility] - [place], [place] - [address] [place] [address] Phone: [phone] Fax: [phone]  [facility] - [employer] [place], [place] - [place] [address] [place] [address] Phone: [phone] Fax: [phone]     Social Determinants of Health (SDOH) Interventions    Readmission Risk Interventions No flowsheet data found.

## 2019-04-22 NOTE — Discharge Instructions (Signed)
Information on my medicine - ELIQUIS (apixaban)  This medication education was reviewed with me or my healthcare representative as part of my discharge preparation.    Why was Eliquis prescribed for you? Eliquis was prescribed to treat blood clots that may have been found in the veins of your legs (deep vein thrombosis) or in your lungs (pulmonary embolism) and to reduce the risk of them occurring again.  What do You need to know about Eliquis ? The starting dose is 10 mg (two 5 mg tablets) taken TWICE daily for the FIRST SEVEN (7) DAYS, then on 10/11  the dose is reduced to ONE 5 mg tablet taken TWICE daily.  Eliquis may be taken with or without food.   Try to take the dose about the same time in the morning and in the evening. If you have difficulty swallowing the tablet whole please discuss with your pharmacist how to take the medication safely.  Take Eliquis exactly as prescribed and DO NOT stop taking Eliquis without talking to the doctor who prescribed the medication.  Stopping may increase your risk of developing a new blood clot.  Refill your prescription before you run out.  After discharge, you should have regular check-up appointments with your healthcare provider that is prescribing your Eliquis.    What do you do if you miss a dose? If a dose of ELIQUIS is not taken at the scheduled time, take it as soon as possible on the same day and twice-daily administration should be resumed. The dose should not be doubled to make up for a missed dose.  Important Safety Information A possible side effect of Eliquis is bleeding. You should call your healthcare provider right away if you experience any of the following: ? Bleeding from an injury or your nose that does not stop. ? Unusual colored urine (red or dark brown) or unusual colored stools (red or black). ? Unusual bruising for unknown reasons. ? A serious fall or if you hit your head (even if there is no bleeding).  Some  medicines may interact with Eliquis and might increase your risk of bleeding or clotting while on Eliquis. To help avoid this, consult your healthcare provider or pharmacist prior to using any new prescription or non-prescription medications, including herbals, vitamins, non-steroidal anti-inflammatory drugs (NSAIDs) and supplements.  This website has more information on Eliquis (apixaban): http://www.eliquis.com/eliquis/home  Assistance for Uninsured Patients Visit [employer] or call 5121942207 for more information about an independent charitable program that provides free medication to eligible, uninsured patients who are experiencing financial hardship.

## 2019-04-22 NOTE — Progress Notes (Signed)
Transitions of Care Pharmacist Note  Sarah Burch is a 48 y.o. female that has been diagnosed with a SMV Thrombus and will be prescribed Eliquis (apixaban) at discharge.   Patient Education: I provided the following education on 04/22/19 to the patient: How to take the medication Described what the medication is Signs of bleeding Signs/symptoms of VTE and stroke  Answered their questions  Discharge Medications Plan: The patient wants to have their discharge medications filled by the Transitions of Care pharmacy rather than their usual pharmacy.  The primary doctor for this patient has been contacted to send all discharge medication prescriptions to the Transitions of Care pharmacy, the discharge orders pharmacy has been changed to the Transitions of Care pharmacy, the patient will receive a phone call regarding co-pay, and their medications will be delivered by the Transitions of Care pharmacy.   Insurance information: N/A   Thank you,   Lorel Monaco, PharmD PGY1 Ambulatory Care Resident [employer] # [phone]  April 22, 2019

## 2019-04-22 NOTE — Progress Notes (Signed)
PROGRESS NOTE    Sarah Burch   IRS:[id number]  DOB: [birth year]  DOA: 04/17/2019 PCP: Patient, No Pcp Per   Brief Narrative:  Sarah Burch  is a 48 y.o. female with medical history significant of chronic pancreatitis, GERD, hypertension, tobacco abuse, marijuana abuse presents to emergency department due to worsening epigastric pain and nausea starting the day before coming to the ED.  Patient reports that her symptoms started last month however got worse yesterday.  She is unable to keep anything down.  Reports 10 out of 10 epigastric pain, radiates to her back, no aggravating factors, relieved with ibuprofen, associated with nausea.   CT scan of the abdomen pelvis in the ED notes the following:  Diffusely edematous pancreas compatible with acute pancreatitis. Lack of enhancement of an atrophic pancreatic body segment, question pancreatic necrosis though this could be acute or old. Scattered calcifications at pancreatic tail, few calcifications at head and uncinate process. Small fluid collection at uncinate process consistent with pseudo cyst, 18 x 14 mm image 30. Additional small less well-defined fluid collection at head/body 8 x 6 mm. Edema of duodenal wall. Infiltrative changes extend to the LEFT medial margin of the gallbladder, surrounding duodenum, and extend into lesser sac.  Subjective: She vomited up dinner last night. She has had severe abdominal pain and nausea since.  Assessment & Plan:   Principal Problem: Abdominal pain  (A) Acute pancreatitis-  Pseudocysts of pancreas -The patient states that she is abstaining from alcohol-triglycerides not elevated- current medication - her lipase is normal however, CT scan is suggestive of an edematous pancreas with calcifications suggestive of chronic pancreatitis -Continue normal saline at 125 cc an hour - increased frequency of Morphine to Every 3 hrs PRN - Added Oxycodone to see if she can manage without the Morphine -   MRCP shows acute pancreatitis with small pseudocysts - cont Pancrealipase  - she is unable to tolerate solid food - fecal elastase is pending - will consult GI as she still has not improved -  (B) Acute Gastritis and duodenitis >-Duodenum is edematous-this may be secondary to acute pancreatitis but may be acute duodenitis as well -  she also admits to taking 8-10 Ibuprofen daily to help control her abdominal pain at home and thus likely also has a severe gastritis or PUD- cont BID PPI  Active Problems:   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 Valetta Mole recommends IV heparin and if she tolerates this without bleeding, change to DOAC for 3 more months and then to aspirin  - change Heparin to Eliquis today as it does not seem she will need any procedures  Hypokalemia, hypophosphatemia and hypomagnesemia -Replace again today and continue to follow  Metabolic acidosis -Likely secondary to elevated chloride level and possibly ketoacidosis due to being n.p.o. - slightly improved    Essential hypertension -Continue amlodipine    Protein calorie malnutrition-severe Body mass index is 18.52 kg/m. -  started supplements      Marijuana abuse   Tobacco abuse --She is only smoking 3 to 4 cigarettes now- counseled    Asymptomatic bacteriuria -Greater than 100,000 e coli-hold off on treating as she is asymptomatic    Macrocytosis -B12 level is normal-  Folic acid level normal - I suspect she may be drinking alcohol even if she denies it   Time spent in minutes: 35 DVT prophylaxis: Heparin infusion &  SCDs Code Status: Full code Family Communication:  Disposition Plan: Home when able to tolerate solid food Consultants:   None Procedures:   None Antimicrobials:  Anti-infectives (From admission, onward)   Start     Dose/Rate Route Frequency Ordered Stop   04/18/19 0000   piperacillin-tazobactam (ZOSYN) IVPB 3.375 g  Status:  Discontinued     3.375 g 12.5 mL/hr over 240 Minutes Intravenous Every 8 hours 04/17/19 1525 04/18/19 1131   04/17/19 1500  piperacillin-tazobactam (ZOSYN) IVPB 3.375 g     3.375 g 100 mL/hr over 30 Minutes Intravenous  Once 04/17/19 1447 04/17/19 1646     Objective: Vitals:   04/21/19 1701 04/21/19 2108 04/22/19 0450 04/22/19 0813  BP: 129/77 (!) 141/87 (!) 142/91 (!) 173/99  Pulse: 78 87 92 93  Resp: Temp: 98.1 F (36.7 C) 98.2 F (36.8 C) 98.2 F (36.8 C) (!) 97.5 F (36.4 C)  TempSrc: Oral Oral Oral Oral  SpO2: 97% 97% 95% 98%  Weight:      Height:        Intake/Output Summary (Last 24 hours) at 04/22/2019 1423 Last data filed at 04/22/2019 1030 Gross per 24 hour  Intake 3495.8 ml  Output 0 ml  Net 3495.8 ml   Filed Weights   04/17/19 1331  Weight: 41.6 kg    Examination: General exam: Appears comfortable  HEENT: PERRLA, oral mucosa moist, no sclera icterus or thrush Respiratory system: Clear to auscultation. Respiratory effort normal. Cardiovascular system: S1 & S2 heard,  No murmurs  Gastrointestinal system: Abdomen soft, diffusely tender in abdomen, nondistended. Normal bowel sounds   Central nervous system: Alert and oriented. No focal neurological deficits. Extremities: No cyanosis, clubbing or edema Skin: No rashes or ulcers Psychiatry:  depressed    Data Reviewed: I have personally reviewed following labs and imaging studies  CBC: Recent Labs  Lab 04/18/19 0421 04/19/19 0358 04/20/19 0342 04/21/19 0329 04/22/19 0529  WBC 7.6 6.7 5.8 8.6 8.6  HGB 10.0* 10.6* 9.4* 9.5* 9.4*  HCT 29.1* 31.7* 28.5* 29.1* 28.1*  MCV 104.7* 105.0* 105.9* 104.7* 103.3*  PLT 270 261 235 232 247   Basic Metabolic Panel: Recent Labs  Lab 04/17/19 1530 04/17/19 2127 04/18/19 0421 04/19/19 0358 04/20/19 0752 04/21/19 0329 04/22/19 0529  NA  --   --  139 137 138 139 140  K  --   --  3.4* 3.8  3.2* 4.4 3.4*  CL  --   --  115* 111 113* 113* 110  CO2  --   --  15* 17* 19* 19* 23  GLUCOSE  --   --  47* 69* 130* 168* 133*  BUN  --   --  6 <5* <5* <5* <5*  CREATININE  --   --  0.81 0.70 0.59 0.63 0.63  CALCIUM  --   --  8.2* 8.4* 8.0* 8.5* 8.4*  MG 1.7 1.6*  --  1.5* 1.6* 1.4* 1.6*  PHOS 2.8 2.1*  --  2.6  --  3.4 3.0   GFR: Estimated Creatinine Clearance: 55.3 mL/min (by C-G formula based on SCr of 0.63 mg/dL). Liver Function Tests: Recent Labs  Lab 04/17/19 0712  AST 24  ALT 11  ALKPHOS 95  BILITOT 0.7  PROT 6.8  ALBUMIN 3.1*   Recent Labs  Lab 04/17/19 0712  LIPASE 42   No results for input(s): AMMONIA in the last 168 hours. Coagulation Profile: No results for input(s): INR, PROTIME in the last  168 hours. Cardiac Enzymes: No results for input(s): CKTOTAL, CKMB, CKMBINDEX, TROPONINI in the last 168 hours. BNP (last 3 results) No results for input(s): PROBNP in the last 8760 hours. HbA1C: No results for input(s): HGBA1C in the last 72 hours. CBG: No results for input(s): GLUCAP in the last 168 hours. Lipid Profile: No results for input(s): CHOL, HDL, LDLCALC, TRIG, CHOLHDL, LDLDIRECT in the last 72 hours. Thyroid Function Tests: No results for input(s): TSH, T4TOTAL, FREET4, T3FREE, THYROIDAB in the last 72 hours. Anemia Panel: Recent Labs    04/21/19 1231  FOLATE 14.2   Urine analysis:    Component Value Date/Time   COLORURINE YELLOW 04/17/2019 1123   APPEARANCEUR HAZY (A) 04/17/2019 1123   LABSPEC 1.014 04/17/2019 1123   PHURINE 6.0 04/17/2019 1123   GLUCOSEU NEGATIVE 04/17/2019 1123   HGBUR NEGATIVE 04/17/2019 1123   BILIRUBINUR NEGATIVE 04/17/2019 1123   KETONESUR 20 (A) 04/17/2019 1123   PROTEINUR NEGATIVE 04/17/2019 1123   NITRITE POSITIVE (A) 04/17/2019 1123   LEUKOCYTESUR TRACE (A) 04/17/2019 1123   Sepsis Labs: (procalcitonin:4,lacticidven:4) ) Recent Results (from the past 240 hour(s))  Urine culture     Status: Abnormal     Collection Time: 04/17/19 11:24 AM   Specimen: Urine, Random  Result Value Ref Range Status   Specimen Description URINE, RANDOM  Final   Special Requests   Final    NONE Performed at [facility], [address] [place], [place] [address]    Culture >=100,000 COLONIES/mL ESCHERICHIA COLI (A)  Final   Report Status 04/19/2019 FINAL  Final   Organism ID, Bacteria ESCHERICHIA COLI (A)  Final      Susceptibility   Escherichia coli - MIC*    AMPICILLIN >=32 RESISTANT Resistant     CEFAZOLIN 8 SENSITIVE Sensitive     CEFTRIAXONE <=1 SENSITIVE Sensitive     CIPROFLOXACIN <=0.25 SENSITIVE Sensitive     GENTAMICIN >=16 RESISTANT Resistant     IMIPENEM <=0.25 SENSITIVE Sensitive     NITROFURANTOIN <=16 SENSITIVE Sensitive     TRIMETH/SULFA >=320 RESISTANT Resistant     AMPICILLIN/SULBACTAM >=32 RESISTANT Resistant     PIP/TAZO 8 SENSITIVE Sensitive     Extended ESBL NEGATIVE Sensitive     * >=100,000 COLONIES/mL ESCHERICHIA COLI  SARS CORONAVIRUS 2 (TAT 6-24 HRS) Nasopharyngeal Nasopharyngeal Swab     Status: None   Collection Time: 04/17/19  1:52 PM   Specimen: Nasopharyngeal Swab  Result Value Ref Range Status   SARS Coronavirus 2 NEGATIVE NEGATIVE Final    Comment: (NOTE) SARS-CoV-2 target nucleic acids are NOT DETECTED. The SARS-CoV-2 RNA is generally detectable in upper and lower respiratory specimens during the acute phase of infection. Negative results do not preclude SARS-CoV-2 infection, do not rule out co-infections with other pathogens, and should not be used as the sole basis for treatment or other patient management decisions. Negative results must be combined with clinical observations, patient history, and epidemiological information. The expected result is Negative. Fact Sheet for Patients: HairSlick.no Fact Sheet for Healthcare Providers: quierodirigir.com This test is not yet approved or cleared by the  [place] FDA and  has been authorized for detection and/or diagnosis of SARS-CoV-2 by FDA under an Emergency Use Authorization (EUA). This EUA will remain  in effect (meaning this test can be used) for the duration of the COVID-19 declaration under Section 56 4(b)(1) of the Act, 21 U.S.C. section 360bbb-3(b)(1), unless the authorization is terminated or revoked sooner. Performed at [facility]  [facility], [address] [place], [place] [address]          Radiology Studies: Mr Abdomen Mrcp Vivien RossettiW Wo Contast  Result Date: 04/20/2019 CLINICAL DATA:  48 year old female with suspected pancreatitis. Upper abdominal pain. EXAM: MRI ABDOMEN WITHOUT AND WITH CONTRAST (INCLUDING MRCP) TECHNIQUE: Multiplanar multisequence MR imaging of the abdomen was performed both before and after the administration of intravenous contrast. Heavily T2-weighted images of the biliary and pancreatic ducts were obtained, and three-dimensional MRCP images were rendered by post processing. CONTRAST:  4mL GADAVIST GADOBUTROL 1 MMOL/ML IV SOLN COMPARISON:  No prior abdominal MRI. CT the abdomen and pelvis 04/17/2019. FINDINGS: Lower chest: Unremarkable. Hepatobiliary: No suspicious cystic or solid hepatic lesions. No intra or extrahepatic biliary ductal dilatation noted on MRCP images. Gallbladder is normal in appearance. Pancreas: Unfortunately, due to the extent of active inflammation, trace amount of peripancreatic fluid, and motion related artifact on today's examination, several of the sequences poorly evaluated the pancreas, including MRCP imaging which is essentially nondiagnostic. With these limitations in mind there are several small pancreatic fluid collections, largest of which is in the posterior aspect of the pancreatic head measuring 1.8 x 1.4 x 2.7 cm (axial image 22 of series 3 and coronal image 20 of series 4) demonstrating mild T1 hyperintensity, T2 hyperintensity and no significant internal enhancement, most  compatible with a mildly hemorrhagic pancreatic pseudocyst. Several additional small nonenhancing areas are also noted in the adjacent pancreatic parenchyma best appreciated on delayed imaging (series 1304), but poorly evaluated on other pulse sequences, presumably additional small pancreatic pseudocysts. One of these collections appears to extend anteriorly and to the right, potentially involving the anterior wall of the second portion of the duodenum (axial image 38 of series 1304). These are predominantly in the head of the pancreas, although there is an additional small lesion in the tail of the pancreas (axial image 22 of series 1304) measuring 1 cm, and there are several small collections which appear to extend into the gastrosplenic ligament best appreciated on coronal image 28 of series 14, all likely to represent small pancreatic pseudocysts. Pancreatic parenchyma otherwise appears to enhance normally. Spleen:  Unremarkable. Adrenals/Urinary Tract: Bilateral kidneys and adrenal glands are normal in appearance. No hydroureteronephrosis in the visualized portions of the abdomen. Stomach/Bowel: Probable extension of one of the pancreatic pseudocysts to involve the anterior wall of the second portion of the duodenum, as discussed above. Vascular/Lymphatic: Aortic atherosclerosis. No aneurysm identified in the visualized abdominal vasculature. Filling defect in the superior mesenteric vein, compatible with nonocclusive thrombus (axial image 33 of series 1302). No lymphadenopathy identified in the abdomen. Other: Trace volume of fluid around the pancreas. Trace volume of ascites. Musculoskeletal: No aggressive appearing osseous lesions are noted in the visualized portions of the skeleton. IMPRESSION: 1. Acute pancreatitis with multiple pancreatic pseudocysts, as detailed above. 2. 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 3. Although portions of the MRCP imaging is considered  nondiagnostic, there is no evidence of intrahepatic or extrahepatic biliary ductal dilatation, and no definite pancreatic ductal dilatation. Electronically Signed   By: Trudie Reedaniel  Entrikin M.D.   On: 04/20/2019 19:27      Scheduled Meds:  amLODipine  10 mg Oral Daily   apixaban  10 mg Oral BID   Followed by   Melene Muller[START ON 04/28/2019] apixaban  5 mg Oral BID   diclofenac sodium  2 g Topical QID   feeding supplement  1 Container Oral TID BM   lipase/protease/amylase  24,000 Units Oral  TID AC   multivitamin with minerals  1 tablet Oral Daily   pantoprazole  40 mg Oral BID AC   sodium chloride flush  3 mL Intravenous Once   Continuous Infusions:  sodium chloride 125 mL/hr at 04/22/19 1020     LOS: 4 days      Debbe Odea, MD [facility] Pager: [email or web address] Password [email or web address] 04/22/2019, 2:23 PM

## 2019-04-23 DIAGNOSIS — K8500 Idiopathic acute pancreatitis without necrosis or infection: Secondary | ICD-10-CM

## 2019-04-23 LAB — CBC
HCT: 29.5 % — ABNORMAL LOW (ref 36.0–46.0)
Hemoglobin: 9.9 g/dL — ABNORMAL LOW (ref 12.0–15.0)
MCH: 34.5 pg — ABNORMAL HIGH (ref 26.0–34.0)
MCHC: 33.6 g/dL (ref 30.0–36.0)
MCV: 102.8 fL — ABNORMAL HIGH (ref 80.0–100.0)
Platelets: 278 10*3/uL (ref 150–400)
RBC: 2.87 MIL/uL — ABNORMAL LOW (ref 3.87–5.11)
RDW: 13.6 % (ref 11.5–15.5)
WBC: 9.9 10*3/uL (ref 4.0–10.5)
nRBC: 0 % (ref 0.0–0.2)

## 2019-04-23 LAB — BASIC METABOLIC PANEL
Anion gap: 10 (ref 5–15)
BUN: <5 mg/dL — ABNORMAL LOW (ref 6–20)
CO2: 22 mmol/L (ref 22–32)
Calcium: 8.5 mg/dL — ABNORMAL LOW (ref 8.9–10.3)
Chloride: 108 mmol/L (ref 98–111)
Creatinine, Ser: 0.61 mg/dL (ref 0.44–1.00)
GFR calc Af Amer: >60 mL/min (ref >60)
GFR calc non Af Amer: >60 mL/min (ref >60)
Glucose, Bld: 89 mg/dL (ref 70–99)
Potassium: 3.3 mmol/L — ABNORMAL LOW (ref 3.5–5.1)
Sodium: 140 mmol/L (ref 135–145)

## 2019-04-23 LAB — PHOSPHORUS: Phosphorus: 3.3 mg/dL (ref 2.5–4.6)

## 2019-04-23 LAB — MAGNESIUM: Magnesium: 1.9 mg/dL (ref 1.7–2.4)

## 2019-04-23 NOTE — Progress Notes (Addendum)
Daily Rounding Note  04/23/2019, 10:47 AM  LOS: 5 days   SUBJECTIVE:   Chief complaint: Pancreatitis.  Small pancreatitis pseudocysts.  SMV thrombus.     Persistent abdominal pain, particularly across the upper abdomen into the left upper quadrant/flank. Used 15 mg oxycodone, 6 mg IV morphine yesterday.  So far today 5 mg oxy, 2 mg morphine. Eating very little of her soft diet which does not cause nausea or increase abdominal pain. Last BM was on 10/4.  OBJECTIVE:         Vital signs in last 24 hours:    Temp:  [98.4 F (36.9 C)-98.6 F (37 C)] 98.4 F (36.9 C) (10/06 0413) Pulse Rate:  [80-85] 80 (10/06 0413) Resp:  [15-18] 16 (10/06 0413) BP: (146-153)/(87-93) 146/88 (10/06 0413) SpO2:  [97 %-98 %] 97 % (10/06 0413) Weight:  [41.2 kg] 41.2 kg (10/05 2000) Last BM Date: 04/21/19 Filed Weights   04/17/19 1331 04/22/19 2000  Weight: 41.6 kg 41.2 kg   General: Thin, somewhat uncomfortable, chronically ill looking, alert. Heart: RRR. Chest: No labored breathing or cough.  Lungs clear bilaterally. Abdomen: Thin, soft, active bowel sounds.  Tender diffusely to very light pressure, more pronounced in upper abdomen. Extremities: No CCE. Neuro/Psych: Alert.  Fully oriented.  No gross deficits.  Intake/Output from previous day: 10/05 0701 - 10/06 0700 In: 3275.2 [P.O.:480; I.V.:2695.2; IV Piggyback:100] Out: 0   Intake/Output this shift: No intake/output data recorded.  Lab Results: Recent Labs    04/21/19 0329 04/22/19 0529 04/23/19 0415  WBC 8.6 8.6 9.9  HGB 9.5* 9.4* 9.9*  HCT 29.1* 28.1* 29.5*  PLT 232 247 278   BMET Recent Labs    04/21/19 0329 04/22/19 0529 04/23/19 0415  NA 139 140 140  K 4.4 3.4* 3.3*  CL 113* 110 108  CO2 19* 23 22  GLUCOSE 168* 133* 89  BUN <5* <5* <5*  CREATININE 0.63 0.63 0.61  CALCIUM 8.5* 8.4* 8.5*   Scheduled Meds: . amLODipine  10 mg Oral Daily  . apixaban  10  mg Oral BID   Followed by  . [START ON 04/28/2019] apixaban  5 mg Oral BID  . diclofenac sodium  2 g Topical QID  . feeding supplement  1 Container Oral TID BM  . lipase/protease/amylase  24,000 Units Oral TID AC  . multivitamin with minerals  1 tablet Oral Daily  . pantoprazole  40 mg Oral BID AC  . sodium chloride flush  3 mL Intravenous Once   Continuous Infusions: . sodium chloride 125 mL/hr at 04/23/19 0433   PRN Meds:.acetaminophen **OR** acetaminophen, hydrALAZINE, morphine injection, ondansetron **OR** ondansetron (ZOFRAN) IV, oxyCODONE   ASSESMENT:   *   Acute on chronic pancreatitis with small, new pseudocysts.  Initial dx pancreatitis 01/2019 but periodic GI symptoms s/o pancreatitis over >/= 5 years.  Etiology of pancreatitis unclear.  ?alcohol w remote hx heavy ETOH and several years mild to moderate consumption (admits to only couple of beers per month).  01/2019 ultrasound showed gallstones.  Pancreatitis and new small pseudocysts but no GB or ductal issues on current MRCP or CT.  Creon in place.    pndg labs: Fecal pancreatic elastase, IgG4, CA-19-9.    *   SMV thromobosis.  Abixiban bid for 3 months.   Factor V Leiden, prothrombin gene mutation pndg.    *     Macrocytic anemia, Hgb stable.    Macrocytosis is not new  but the anemia is new.  Folate and b12 normal.     *      Heavy/excessive use of ibuprofen over the last several weeks.  Fortunately her renal function not compromised. s/o duodenitis per CT, could be from ibuprofen or secondary to the pancreatitis.   BID Protonix in place.  *   Severe PMC.  Boost resource breeze in place  *   Asymptomatic E coli bacteruria.  Attending holding off on abx.     *   Hypokalemia.      PLAN   *   Drop Protonix to 1 x day.   Continue supportive care.  No change in her diet.  Hopefully we are not approaching a situation of chronic abdominal pain    [name]  04/23/2019, 10:47 AM Phone [phone]    Attending  physician's note   I have taken an interval history, reviewed the chart and examined the patient. I agree with the Advanced Practitioner's note, impression and recommendations.   Doing much better this morning.  Tolerating diet.  Abdominal pain is back to baseline.  Wants to give another 24 hours.  Acute on chronic calcific pancreatitis, likely d/t ETOH/smoking, with small pseudocysts (too small and immature to be drained). No PD dilatation.  No exocrine insufficiency. SMV thrombosis.  No mesenteric ischemia.  Plan: -Follow results of CA 19-9, IgG4 as outpatient -Can drop Protonix to 40 mg p.o. QD -Willing to stop alcohol and quit smoking.  I have encouraged her to do so. -Eliquis x 3 months. -Avoid nonsteroidals. -Follow-up in GI clinic in 6-8 weeks. -She told me that she has appointment with family physician as well. -Pancreatic protocol CT as an outpatient in 12 weeks. -Anticipate discharge in a.m. -Okay to continue pancreatic enzymes as an outpatient. -We will sign off for now.  Carmell Austria, MD [facility] [phone].

## 2019-04-23 NOTE — Progress Notes (Addendum)
PROGRESS NOTE    Sarah Burch  ZOX:[id number] DOB: Sep 15, 1970 DOA: 04/17/2019 PCP: Patient, No Pcp Per   Brief Narrative:  Patient is a 48 year old female with history of chronic pancreatitis, GERD, hypertension, tobacco abuse, marijuana abuse who presents the emergency department due to worsening epigastric pain, nausea, inability to tolerate oral diet.  CT abdomen/pelvis done on presentation showed diffusely edematous pancreas compatible with acute bronchitis, scattered calcification at pancreatic tail, 2 small fluid collections and also showed small thrombus in the superior mesenteric vein.  MRI confirmed  pancreatitis with multiple pancreatic pseudocyst.  GI following for pancreatitis.  Prolonged hospitalization due to slow improvement in the abdominal pain, tolerance of diet.  Started on Eliquis for mesenteric vein thrombosis.  Assessment & Plan:   Principal Problem:   Acute pancreatitis Active Problems:   GERD (gastroesophageal reflux disease)   Acute on chronic pancreatitis (HCC)   Hypokalemia   Essential hypertension   Protein calorie malnutrition (HCC)   Marijuana abuse   Tobacco abuse   Asymptomatic bacteriuria   Macrocytosis   Pseudocyst of pancreas   Hypoalbuminemia   Mesenteric vein thrombosis (HCC)   Protein-calorie malnutrition, severe   Acute on chronic pancreatitis with pseudocyst: Denies regular intake of alcohol and says she is a social drinker.  Triglyceride level normal.  No gallbladder ductal issues as per current MRCP/CT.  CT scan/MRI as above.  Her abdominal pain is definitely better but she still complains of epigastric discomfort.  She has been tolerating soft diet for now.  Denies any nausea and vomiting today.  Continue IV fluids, pain meds, supportive care.  GI following.  Pending work up for autoimmune pancreatitis :IG G4, CA 19-19, pancreatic elastase.  Mesenteric vein thrombosis: As shown in the CT scan.  Case was discussed with Dr. Randie Heinz who recommended  DOAC for 3 months and then switch to aspirin.  Currently on Eliquis Pending factor V. Leyden, prothrombin gene mutation workup  Hypokalemia/hypophosphatemia/hypomagnesemia: Currently being monitored and supplemented if necessary.  Hypertension: Current blood pressure stable.  Continue current medicines.  Severe protein calorie malnutrition: Due to decreased oral intake.  BMI of 18.3.  Currently on supplements.   Tobacco abuse: Recently tapered from 1 pack a day to 3 to 4 cigarettes a day.  Counseled cessation.  Symptomatic bacteriuria: Significant E. coli in urine culture but she is asymptomatic.  Treatment not indicated.  Macrocytosis: Normal vitamin B12 and folic acid level.Might be associated with chronic alcohol use though she denies regular intake.    Nutrition Problem: Severe Malnutrition Etiology: chronic illness(chronic pancreatitis)      DVT prophylaxis: Eliquis Code Status: Full Family Communication: None present at the bedside Disposition Plan: Home after resolution of pancreatitis, tolerance of diet   Consultants: GI  Procedures:None  Antimicrobials:  Anti-infectives (From admission, onward)   Start     Dose/Rate Route Frequency Ordered Stop   04/18/19 0000  piperacillin-tazobactam (ZOSYN) IVPB 3.375 g  Status:  Discontinued     3.375 g 12.5 mL/hr over 240 Minutes Intravenous Every 8 hours 04/17/19 1525 04/18/19 1131   04/17/19 1500  piperacillin-tazobactam (ZOSYN) IVPB 3.375 g     3.375 g 100 mL/hr over 30 Minutes Intravenous  Once 04/17/19 1447 04/17/19 1646      Subjective:  Patient seen and examined the bedside this morning.  Hemodynamically stable. feels slightly better today  But  epigastric discomfort is still there.  Tolerating soft diet.  No nausea or vomiting  Objective: Vitals:   04/22/19 1645 04/22/19 2000  04/23/19 0413 04/23/19 1000  BP: (!) 153/87 (!) 152/93 (!) 146/88 (!) 153/87  Pulse: 85 80 80 89  Resp: 18 15 16 18   Temp: 98.5 F  (36.9 C) 98.6 F (37 C) 98.4 F (36.9 C) 98.2 F (36.8 C)  TempSrc: Oral Oral Oral Oral  SpO2: 98% 97% 97% 98%  Weight:  41.2 kg    Height:        Intake/Output Summary (Last 24 hours) at 04/23/2019 1257 Last data filed at 04/23/2019 0815 Gross per 24 hour  Intake 3064.34 ml  Output 0 ml  Net 3064.34 ml   Filed Weights   04/17/19 1331 04/22/19 2000  Weight: 41.6 kg 41.2 kg    Examination:  General exam: Generalized weakness, chronically ill looking, deconditioned/debilitated,cachetic HEENT:PERRL,Oral mucosa moist, Ear/Nose normal on gross exam Respiratory system: Bilateral equal air entry, normal vesicular breath sounds, no wheezes or crackles  Cardiovascular system: S1 & S2 heard, RRR. No JVD, murmurs, rubs, gallops or clicks. No pedal edema. Gastrointestinal system: Abdomen is nondistended, soft has epigastric tenderness. no organomegaly or masses felt. Normal bowel sounds heard. Central nervous system: Alert and oriented. No focal neurological deficits. Extremities: No edema, no clubbing ,no cyanosis, distal peripheral pulses palpable. Skin: No rashes, lesions or ulcers,no icterus ,no pallor    Data Reviewed: I have personally reviewed following labs and imaging studies  CBC: Recent Labs  Lab 04/19/19 0358 04/20/19 0342 04/21/19 0329 04/22/19 0529 04/23/19 0415  WBC 6.7 5.8 8.6 8.6 9.9  HGB 10.6* 9.4* 9.5* 9.4* 9.9*  HCT 31.7*  30.6* 28.5* 29.1* 28.1* 29.5*  MCV 105.0* 105.9* 104.7* 103.3* 102.8*  PLT 261 235 232 247 278   Basic Metabolic Panel: Recent Labs  Lab 04/17/19 2127  04/19/19 0358 04/20/19 0752 04/21/19 0329 04/22/19 0529 04/23/19 0415  NA  --    < > 137 138 139 140 140  K  --    < > 3.8 3.2* 4.4 3.4* 3.3*  CL  --    < > 111 113* 113* 110 108  CO2  --    < > 17* 19* 19* 23 22  GLUCOSE  --    < > 69* 130* 168* 133* 89  BUN  --    < > <5* <5* <5* <5* <5*  CREATININE  --    < > 0.70 0.59 0.63 0.63 0.61  CALCIUM  --    < > 8.4* 8.0* 8.5*  8.4* 8.5*  MG 1.6*  --  1.5* 1.6* 1.4* 1.6* 1.9  PHOS 2.1*  --  2.6  --  3.4 3.0 3.3   < > = values in this interval not displayed.   GFR: Estimated Creatinine Clearance: 54.7 mL/min (by C-G formula based on SCr of 0.61 mg/dL). Liver Function Tests: Recent Labs  Lab 04/17/19 0712  AST 24  ALT 11  ALKPHOS 95  BILITOT 0.7  PROT 6.8  ALBUMIN 3.1*   Recent Labs  Lab 04/17/19 0712  LIPASE 42   No results for input(s): AMMONIA in the last 168 hours. Coagulation Profile: No results for input(s): INR, PROTIME in the last 168 hours. Cardiac Enzymes: No results for input(s): CKTOTAL, CKMB, CKMBINDEX, TROPONINI in the last 168 hours. BNP (last 3 results) No results for input(s): PROBNP in the last 8760 hours. HbA1C: No results for input(s): HGBA1C in the last 72 hours. CBG: No results for input(s): GLUCAP in the last 168 hours. Lipid Profile: No results for input(s): CHOL, HDL, LDLCALC, TRIG, CHOLHDL, LDLDIRECT  in the last 72 hours. Thyroid Function Tests: No results for input(s): TSH, T4TOTAL, FREET4, T3FREE, THYROIDAB in the last 72 hours. Anemia Panel: Recent Labs    04/21/19 1231  FOLATE 14.2   Sepsis Labs: Recent Labs  Lab 04/17/19 1435 04/17/19 1555  LATICACIDVEN 0.7 0.8    Recent Results (from the past 240 hour(s))  Urine culture     Status: Abnormal   Collection Time: 04/17/19 11:24 AM   Specimen: Urine, Random  Result Value Ref Range Status   Specimen Description URINE, RANDOM  Final   Special Requests   Final    NONE Performed at [facility], [address] [place], [place] [address]    Culture >=100,000 COLONIES/mL ESCHERICHIA COLI (A)  Final   Report Status 04/19/2019 FINAL  Final   Organism ID, Bacteria ESCHERICHIA COLI (A)  Final      Susceptibility   Escherichia coli - MIC*    AMPICILLIN >=32 RESISTANT Resistant     CEFAZOLIN 8 SENSITIVE Sensitive     CEFTRIAXONE <=1 SENSITIVE Sensitive     CIPROFLOXACIN <=0.25 SENSITIVE Sensitive      GENTAMICIN >=16 RESISTANT Resistant     IMIPENEM <=0.25 SENSITIVE Sensitive     NITROFURANTOIN <=16 SENSITIVE Sensitive     TRIMETH/SULFA >=320 RESISTANT Resistant     AMPICILLIN/SULBACTAM >=32 RESISTANT Resistant     PIP/TAZO 8 SENSITIVE Sensitive     Extended ESBL NEGATIVE Sensitive     * >=100,000 COLONIES/mL ESCHERICHIA COLI  SARS CORONAVIRUS 2 (TAT 6-24 HRS) Nasopharyngeal Nasopharyngeal Swab     Status: None   Collection Time: 04/17/19  1:52 PM   Specimen: Nasopharyngeal Swab  Result Value Ref Range Status   SARS Coronavirus 2 NEGATIVE NEGATIVE Final    Comment: (NOTE) SARS-CoV-2 target nucleic acids are NOT DETECTED. The SARS-CoV-2 RNA is generally detectable in upper and lower respiratory specimens during the acute phase of infection. Negative results do not preclude SARS-CoV-2 infection, do not rule out co-infections with other pathogens, and should not be used as the sole basis for treatment or other patient management decisions. Negative results must be combined with clinical observations, patient history, and epidemiological information. The expected result is Negative. Fact Sheet for Patients: HairSlick.no Fact Sheet for Healthcare Providers: quierodirigir.com This test is not yet approved or cleared by the [place] FDA and  has been authorized for detection and/or diagnosis of SARS-CoV-2 by FDA under an Emergency Use Authorization (EUA). This EUA will remain  in effect (meaning this test can be used) for the duration of the COVID-19 declaration under Section 56 4(b)(1) of the Act, 21 U.S.C. section 360bbb-3(b)(1), unless the authorization is terminated or revoked sooner. Performed at [facility], [address], [place], [place] [address]          Radiology Studies: No results found.      Scheduled Meds: . amLODipine  10 mg Oral Daily  . apixaban  10 mg Oral BID   Followed by  .  [START ON 04/28/2019] apixaban  5 mg Oral BID  . diclofenac sodium  2 g Topical QID  . feeding supplement  1 Container Oral TID BM  . lipase/protease/amylase  24,000 Units Oral TID AC  . multivitamin with minerals  1 tablet Oral Daily  . pantoprazole  40 mg Oral BID AC  . sodium chloride flush  3 mL Intravenous Once   Continuous Infusions: . sodium chloride 125 mL/hr at 04/23/19 1250     LOS:  5 days    Time spent: 35 mins,More than 50% of that time was spent in counseling and/or coordination of care.      Burnadette Pop, MD [facility] Pager [phone]  If 7PM-7AM, please contact night-coverage [email or web address] Password [email or web address] 04/23/2019, 12:57 PM

## 2019-04-24 DIAGNOSIS — N3000 Acute cystitis without hematuria: Secondary | ICD-10-CM

## 2019-04-24 LAB — CANCER ANTIGEN 19-9: CA 19-9: 73 U/mL — ABNORMAL HIGH (ref 0–35)

## 2019-04-24 LAB — CBC
HCT: 28.4 % — ABNORMAL LOW (ref 36.0–46.0)
Hemoglobin: 9.2 g/dL — ABNORMAL LOW (ref 12.0–15.0)
MCH: 34.2 pg — ABNORMAL HIGH (ref 26.0–34.0)
MCHC: 32.4 g/dL (ref 30.0–36.0)
MCV: 105.6 fL — ABNORMAL HIGH (ref 80.0–100.0)
Platelets: 256 10*3/uL (ref 150–400)
RBC: 2.69 MIL/uL — ABNORMAL LOW (ref 3.87–5.11)
RDW: 13.7 % (ref 11.5–15.5)
WBC: 8.3 10*3/uL (ref 4.0–10.5)
nRBC: 0 % (ref 0.0–0.2)

## 2019-04-24 LAB — BASIC METABOLIC PANEL
Anion gap: 5 (ref 5–15)
BUN: <5 mg/dL — ABNORMAL LOW (ref 6–20)
CO2: 23 mmol/L (ref 22–32)
Calcium: 8.8 mg/dL — ABNORMAL LOW (ref 8.9–10.3)
Chloride: 113 mmol/L — ABNORMAL HIGH (ref 98–111)
Creatinine, Ser: 0.66 mg/dL (ref 0.44–1.00)
GFR calc Af Amer: >60 mL/min (ref >60)
GFR calc non Af Amer: >60 mL/min (ref >60)
Glucose, Bld: 90 mg/dL (ref 70–99)
Potassium: 4 mmol/L (ref 3.5–5.1)
Sodium: 141 mmol/L (ref 135–145)

## 2019-04-24 LAB — PHOSPHORUS: Phosphorus: 3 mg/dL (ref 2.5–4.6)

## 2019-04-24 LAB — IGG 4: IgG, Subclass 4: 42 mg/dL (ref 2–96)

## 2019-04-24 LAB — MAGNESIUM: Magnesium: 1.6 mg/dL — ABNORMAL LOW (ref 1.7–2.4)

## 2019-04-24 MED ORDER — MAGNESIUM SULFATE 4 GM/100ML IV SOLN
4.0000 g | Freq: Once | INTRAVENOUS | Status: AC
  Administered 2019-04-24: 4 g via INTRAVENOUS
  Filled 2019-04-24: qty 100

## 2019-04-24 MED ORDER — ENSURE ENLIVE PO LIQD
237.0000 mL | Freq: Three times a day (TID) | ORAL | Status: DC
  Administered 2019-04-24 – 2019-04-25 (×3): 237 mL via ORAL

## 2019-04-24 MED ORDER — SODIUM CHLORIDE 0.9 % IV SOLN
INTRAVENOUS | Status: DC
  Administered 2019-04-24 – 2019-04-25 (×2): via INTRAVENOUS

## 2019-04-24 NOTE — Plan of Care (Signed)
  Problem: Activity: Goal: Risk for activity intolerance will decrease Outcome: Progressing   

## 2019-04-24 NOTE — Progress Notes (Signed)
PROGRESS NOTE    Sarah ShyJulia L Burch  ZOX:[id number] DOB: 28-Dec-1970 DOA: 04/17/2019 PCP: Patient, No Pcp Per   Brief Narrative: 48 year old female with history of chronic pancreatitis, GERD, hypertension, tobacco abuse, marijuana abuse who presents the emergency department due to worsening epigastric pain, nausea, inability to tolerate oral diet.  CT abdomen/pelvis done on presentation showed diffusely edematous pancreas compatible with acute bronchitis, scattered calcification at pancreatic tail, 2 small fluid collections and also showed small thrombus in the superior mesenteric vein.  MRI confirmed  pancreatitis with multiple pancreatic pseudocyst.  GI following for pancreatitis.  Prolonged hospitalization due to slow improvement in the abdominal pain, tolerance of diet.  Started on Eliquis for mesenteric vein thrombosis.  04/24/2019 patient reports she threw up on yesterday for which [facility] complaining of abdominal pain. Assessment & Plan:   Principal Problem:   Acute pancreatitis Active Problems:   GERD (gastroesophageal reflux disease)   Acute on chronic pancreatitis (HCC)   Hypokalemia   Essential hypertension   Protein calorie malnutrition (HCC)   Marijuana abuse   Tobacco abuse   Asymptomatic bacteriuria   Macrocytosis   Pseudocyst of pancreas   Hypoalbuminemia   Mesenteric vein thrombosis (HCC)   Protein-calorie malnutrition, severe  #1 acute on chronic pancreatitis with small pseudocyst and SMV thrombus-the etiology of her pancreatitis is unclear.  She denies use of regular alcohol however she states she is a social drinker.  Triglyceride levels are normal.  She continues to complain of nausea and vomiting.  Gallstones present by ultrasound 02/04/2019.  However no evidence of gallbladder or ductal issues present on MRCP. Patient with persistent nausea vomiting I will start her on fluids until she is able to tolerate p.o. intake. Continue Creon. Continue apixaban twice a day  for total of 3 months. Hypercoagulable work-up is also been sent out. Autoimmune pancreatitis work-up has been sent which includes IgG4, CA-19-9, fecal pancreatic elastase. CA-19-9 is 73 which is high normal being up to 35. IgG4 is 42 is normal GI following.  #2 microcytic anemia with normal B12 and folic acid.  #3 asymptomatic bacteriuria no treatment indicated  #4 severe protein calorie malnutrition on supplements BMI of 18.3.  #5 multiple electrolyte abnormalities hypokalemia resolved continues to be hypomagnesemic being repleted.  #6 tobacco abuse counseled against cessation    Nutrition Problem: Severe Malnutrition Etiology: chronic illness(chronic pancreatitis)     Signs/Symptoms: energy intake < 75% for > or equal to 1 month, severe fat depletion, severe muscle depletion    Interventions: Ensure Enlive (each supplement provides 350kcal and 20 grams of protein), Boost Breeze, MVI  Estimated body mass index is 18.48 kg/m as calculated from the following:   Height as of this encounter: 4\' 11"  (1.499 m).   Weight as of this encounter: 41.5 kg.  DVT prophylaxis: Apixaban  code Status: Full code  family Communication: None present Disposition Plan: Pending tolerance of diet patient should be able to tolerate her diet prior to discharge Consultants:   GI  Procedures: None  antimicrobials: None  Subjective: Resting in bed complaining of nausea and vomiting vomited all day yesterday not able to keep anything down  Objective: Vitals:   04/23/19 1648 04/23/19 2146 04/24/19 0453 04/24/19 1023  BP: (!) 147/82 (!) 153/88 130/83 (!) 156/84  Pulse: 84 82 82 88  Resp:  15 16 18   Temp:  99.1 F (37.3 C) 98.6 F (37 C) 98.4 F (36.9 C)  TempSrc:  Oral Oral Oral  SpO2: 97% 97% 96%  99%  Weight:  41.5 kg    Height:        Intake/Output Summary (Last 24 hours) at 04/24/2019 1334 Last data filed at 04/24/2019 1020 Gross per 24 hour  Intake 3259.62 ml  Output 400 ml   Net 2859.62 ml   Filed Weights   04/17/19 1331 04/22/19 2000 04/23/19 2146  Weight: 41.6 kg 41.2 kg 41.5 kg    Examination:  General exam: Appears calm and comfortable  Respiratory system: Clear to auscultation. Respiratory effort normal. Cardiovascular system: S1 & S2 heard, RRR. No JVD, murmurs, rubs, gallops or clicks. No pedal edema. Gastrointestinal system: Abdomen is nondistended, soft and tender. No organomegaly or masses felt. Normal bowel sounds heard. Central nervous system: Alert and oriented. No focal neurological deficits. Extremities: Symmetric 5 x 5 power. Skin: No rashes, lesions or ulcers Psychiatry: Judgement and insight appear normal. Mood & affect appropriate.     Data Reviewed: I have personally reviewed following labs and imaging studies  CBC: Recent Labs  Lab 04/20/19 0342 04/21/19 0329 04/22/19 0529 04/23/19 0415 04/24/19 0345  WBC 5.8 8.6 8.6 9.9 8.3  HGB 9.4* 9.5* 9.4* 9.9* 9.2*  HCT 28.5* 29.1* 28.1* 29.5* 28.4*  MCV 105.9* 104.7* 103.3* 102.8* 105.6*  PLT 235 232 247 278 967   Basic Metabolic Panel: Recent Labs  Lab 04/19/19 0358 04/20/19 0752 04/21/19 0329 04/22/19 0529 04/23/19 0415 04/24/19 0345  NA 137 138 139 140 140 141  K 3.8 3.2* 4.4 3.4* 3.3* 4.0  CL 111 113* 113* 110 108 113*  CO2 17* 19* 19* 23 22 23   GLUCOSE 69* 130* 168* 133* 89 90  BUN <5* <5* <5* <5* <5* <5*  CREATININE 0.70 0.59 0.63 0.63 0.61 0.66  CALCIUM 8.4* 8.0* 8.5* 8.4* 8.5* 8.8*  MG 1.5* 1.6* 1.4* 1.6* 1.9 1.6*  PHOS 2.6  --  3.4 3.0 3.3 3.0   GFR: Estimated Creatinine Clearance: 55.1 mL/min (by C-G formula based on SCr of 0.66 mg/dL). Liver Function Tests: No results for input(s): AST, ALT, ALKPHOS, BILITOT, PROT, ALBUMIN in the last 168 hours. No results for input(s): LIPASE, AMYLASE in the last 168 hours. No results for input(s): AMMONIA in the last 168 hours. Coagulation Profile: No results for input(s): INR, PROTIME in the last 168 hours. Cardiac  Enzymes: No results for input(s): CKTOTAL, CKMB, CKMBINDEX, TROPONINI in the last 168 hours. BNP (last 3 results) No results for input(s): PROBNP in the last 8760 hours. HbA1C: No results for input(s): HGBA1C in the last 72 hours. CBG: No results for input(s): GLUCAP in the last 168 hours. Lipid Profile: No results for input(s): CHOL, HDL, LDLCALC, TRIG, CHOLHDL, LDLDIRECT in the last 72 hours. Thyroid Function Tests: No results for input(s): TSH, T4TOTAL, FREET4, T3FREE, THYROIDAB in the last 72 hours. Anemia Panel: No results for input(s): VITAMINB12, FOLATE, FERRITIN, TIBC, IRON, RETICCTPCT in the last 72 hours. Sepsis Labs: Recent Labs  Lab 04/17/19 1435 04/17/19 1555  LATICACIDVEN 0.7 0.8    Recent Results (from the past 240 hour(s))  Urine culture     Status: Abnormal   Collection Time: 04/17/19 11:24 AM   Specimen: Urine, Random  Result Value Ref Range Status   Specimen Description URINE, RANDOM  Final   Special Requests   Final    NONE Performed at [facility], [address], [place], [place] [address]    Culture >=100,000 COLONIES/mL ESCHERICHIA COLI (A)  Final   Report Status 04/19/2019 FINAL  Final   Organism  ID, Bacteria ESCHERICHIA COLI (A)  Final      Susceptibility   Escherichia coli - MIC*    AMPICILLIN >=32 RESISTANT Resistant     CEFAZOLIN 8 SENSITIVE Sensitive     CEFTRIAXONE <=1 SENSITIVE Sensitive     CIPROFLOXACIN <=0.25 SENSITIVE Sensitive     GENTAMICIN >=16 RESISTANT Resistant     IMIPENEM <=0.25 SENSITIVE Sensitive     NITROFURANTOIN <=16 SENSITIVE Sensitive     TRIMETH/SULFA >=320 RESISTANT Resistant     AMPICILLIN/SULBACTAM >=32 RESISTANT Resistant     PIP/TAZO 8 SENSITIVE Sensitive     Extended ESBL NEGATIVE Sensitive     * >=100,000 COLONIES/mL ESCHERICHIA COLI  SARS CORONAVIRUS 2 (TAT 6-24 HRS) Nasopharyngeal Nasopharyngeal Swab     Status: None   Collection Time: 04/17/19  1:52 PM   Specimen: Nasopharyngeal Swab  Result Value  Ref Range Status   SARS Coronavirus 2 NEGATIVE NEGATIVE Final    Comment: (NOTE) SARS-CoV-2 target nucleic acids are NOT DETECTED. The SARS-CoV-2 RNA is generally detectable in upper and lower respiratory specimens during the acute phase of infection. Negative results do not preclude SARS-CoV-2 infection, do not rule out co-infections with other pathogens, and should not be used as the sole basis for treatment or other patient management decisions. Negative results must be combined with clinical observations, patient history, and epidemiological information. The expected result is Negative. Fact Sheet for Patients: HairSlick.no Fact Sheet for Healthcare Providers: quierodirigir.com This test is not yet approved or cleared by the [place] FDA and  has been authorized for detection and/or diagnosis of SARS-CoV-2 by FDA under an Emergency Use Authorization (EUA). This EUA will remain  in effect (meaning this test can be used) for the duration of the COVID-19 declaration under Section 56 4(b)(1) of the Act, 21 U.S.C. section 360bbb-3(b)(1), unless the authorization is terminated or revoked sooner. Performed at [facility], [address], [place], [place] [address]          Radiology Studies: No results found.      Scheduled Meds: . amLODipine  10 mg Oral Daily  . apixaban  10 mg Oral BID   Followed by  . [START ON 04/28/2019] apixaban  5 mg Oral BID  . diclofenac sodium  2 g Topical QID  . feeding supplement (ENSURE ENLIVE)  237 mL Oral TID BM  . lipase/protease/amylase  24,000 Units Oral TID AC  . multivitamin with minerals  1 tablet Oral Daily  . pantoprazole  40 mg Oral BID AC  . sodium chloride flush  3 mL Intravenous Once   Continuous Infusions: . sodium chloride 125 mL/hr at 04/23/19 2056     LOS: 6 days     Alwyn Ren, MD [facility]  If 7PM-7AM, please contact  night-coverage [email or web address] Password [email or web address] 04/24/2019, 1:34 PM

## 2019-04-24 NOTE — Progress Notes (Signed)
Nutrition Follow-up  DOCUMENTATION CODES:   Severe malnutrition in context of chronic illness  INTERVENTION:   -D/C Boost Breeze    Ensure Enlive po TID, each supplement provides 350 kcal and 20 grams of protein  MVI daily  NUTRITION DIAGNOSIS:   Severe Malnutrition related to chronic illness(chronic pancreatitis) as evidenced by energy intake < 75% for > or equal to 1 month, severe fat depletion, severe muscle depletion.  Ongoing  GOAL:   Patient will meet greater than or equal to 90% of their needs   Progressing  MONITOR:   PO intake, Supplement acceptance, Weight trends, Labs, I & O's, Skin  REASON FOR ASSESSMENT:   Consult Assessment of nutrition requirement/status  ASSESSMENT:   Patient with PMH significant for chronic pancreatitis, GERD, HTN, and tobacco abuse. Presents this admission with acute pancreatitis and mesenteric vein thrombosis.   Pt reports she had a vomiting episode yesterday but feels better this am. Drinking hot tea to "settle" her stomach. Meal completions charted as 25-85% for her last five meals. Was drinking Boost before vomiting episode but none today. Willing to try Ensure this afternoon. Reiterated the importance of protein intake once d/c.    Admission weight: 41.6 kg  Current weight: 41.5 kg   Medications: creon, MVI with minerals Labs: Mg 1.6 (L) CBG 69-168    Diet Order:   Diet Order            DIET SOFT Room service appropriate? Yes; Fluid consistency: Thin  Diet effective now              EDUCATION NEEDS:   Education needs have been addressed  Skin:  Skin Assessment: Reviewed RN Assessment  Last BM:  10/5  Height:   Ht Readings from Last 1 Encounters:  04/17/19 4\' 11"  (1.499 m)    Weight:   Wt Readings from Last 1 Encounters:  04/23/19 41.5 kg    Ideal Body Weight:  44.3 kg  BMI:  Body mass index is 18.48 kg/m.  Estimated Nutritional Needs:   Kcal:  1300-1500 kcal  Protein:  60-75 grams  Fluid:   >/= 1.3 L/day  Mariana Single RD, LDN Clinical Nutrition Pager # - [phone]

## 2019-04-25 LAB — COMPREHENSIVE METABOLIC PANEL
ALT: 12 U/L (ref 0–44)
AST: 22 U/L (ref 15–41)
Albumin: 2.5 g/dL — ABNORMAL LOW (ref 3.5–5.0)
Alkaline Phosphatase: 75 U/L (ref 38–126)
Anion gap: 9 (ref 5–15)
BUN: <5 mg/dL — ABNORMAL LOW (ref 6–20)
CO2: 24 mmol/L (ref 22–32)
Calcium: 9 mg/dL (ref 8.9–10.3)
Chloride: 108 mmol/L (ref 98–111)
Creatinine, Ser: 0.64 mg/dL (ref 0.44–1.00)
GFR calc Af Amer: >60 mL/min (ref >60)
GFR calc non Af Amer: >60 mL/min (ref >60)
Glucose, Bld: 107 mg/dL — ABNORMAL HIGH (ref 70–99)
Potassium: 4 mmol/L (ref 3.5–5.1)
Sodium: 141 mmol/L (ref 135–145)
Total Bilirubin: 0.3 mg/dL (ref 0.3–1.2)
Total Protein: 5.9 g/dL — ABNORMAL LOW (ref 6.5–8.1)

## 2019-04-25 LAB — CBC
HCT: 29.4 % — ABNORMAL LOW (ref 36.0–46.0)
Hemoglobin: 10.1 g/dL — ABNORMAL LOW (ref 12.0–15.0)
MCH: 35.4 pg — ABNORMAL HIGH (ref 26.0–34.0)
MCHC: 34.4 g/dL (ref 30.0–36.0)
MCV: 103.2 fL — ABNORMAL HIGH (ref 80.0–100.0)
Platelets: 293 10*3/uL (ref 150–400)
RBC: 2.85 MIL/uL — ABNORMAL LOW (ref 3.87–5.11)
RDW: 13.2 % (ref 11.5–15.5)
WBC: 8.2 10*3/uL (ref 4.0–10.5)
nRBC: 0 % (ref 0.0–0.2)

## 2019-04-25 LAB — FACTOR 5 LEIDEN

## 2019-04-25 LAB — LIPASE, BLOOD: Lipase: 41 U/L (ref 11–51)

## 2019-04-25 LAB — MAGNESIUM: Magnesium: 1.7 mg/dL (ref 1.7–2.4)

## 2019-04-25 LAB — PROTHROMBIN GENE MUTATION

## 2019-04-25 MED ORDER — ADULT MULTIVITAMIN W/MINERALS CH: 1.0000 | ORAL_TABLET | Freq: Every day | ORAL | Status: AC

## 2019-04-25 MED ORDER — APIXABAN 5 MG PO TABS: 5.0000 mg | ORAL_TABLET | Freq: Two times a day (BID) | ORAL | 1 refills | Status: DC

## 2019-04-25 MED ORDER — ONDANSETRON HCL 4 MG PO TABS: 4.0000 mg | ORAL_TABLET | Freq: Four times a day (QID) | ORAL | 0 refills | Status: DC | PRN

## 2019-04-25 MED ORDER — OXYCODONE HCL 5 MG PO TABS: 5.0000 mg | ORAL_TABLET | ORAL | 0 refills | Status: DC | PRN

## 2019-04-25 MED ORDER — PANCRELIPASE (LIP-PROT-AMYL) 24000-76000 UNITS PO CPEP: 24000.0000 [IU] | ORAL_CAPSULE | Freq: Three times a day (TID) | ORAL | 1 refills | Status: DC

## 2019-04-25 MED ORDER — AMLODIPINE BESYLATE 10 MG PO TABS: 10.0000 mg | ORAL_TABLET | Freq: Every day | ORAL | 3 refills | Status: DC

## 2019-04-25 MED ORDER — MAGNESIUM SULFATE 4 GM/100ML IV SOLN
4.0000 g | Freq: Once | INTRAVENOUS | Status: AC
  Administered 2019-04-25: 4 g via INTRAVENOUS
  Filled 2019-04-25: qty 100

## 2019-04-25 MED ORDER — APIXABAN 5 MG PO TABS: 10.0000 mg | ORAL_TABLET | Freq: Two times a day (BID) | ORAL | 0 refills | Status: DC

## 2019-04-25 MED ORDER — PANTOPRAZOLE SODIUM 40 MG PO TBEC: 40.0000 mg | DELAYED_RELEASE_TABLET | Freq: Two times a day (BID) | ORAL | 1 refills | Status: DC

## 2019-04-25 MED FILL — oxyCODONE HCL 5 MG TABS: 5 | 5 days supply | Qty: 30 | Fill #0

## 2019-04-25 MED FILL — AMLODIPINE BESYLATE 10 MG T: 10 | 30 days supply | Qty: 30 | Fill #0

## 2019-04-25 MED FILL — ONDANSETRON HCL 4 MG TABLET: 4 | 5 days supply | Qty: 20 | Fill #0

## 2019-04-25 MED FILL — ELIQUIS STARTER PACK 5 MG T: 5 | 25 days supply | Qty: 74 | Fill #0

## 2019-04-25 MED FILL — PANTOPRAZOLE SOD DR 40 MG T: 40 | 30 days supply | Qty: 60 | Fill #0

## 2019-04-25 NOTE — TOC Transition Note (Signed)
Transition of Care [facility]) - CM/SW Discharge Note   Patient Details  Name: Sarah Burch MRN: [id number] Date of Birth: July 28, 1970  Transition [facility] CM/SW Contact:  Bartholomew Crews, RN Phone Number: [phone] 04/25/2019, 12:28 PM   Clinical Narrative:    Spoke with patient at the bedside. Patient with orders to transition home today. Patient upset and wanting to leave, and could not wait to get medications from [facility]. Discussed situation with [facility] pharmacy. Patient provided phone number from [facility] and will return this afternoon to pick up medications. Discussed creon prescription will be at [facility] once manufacturer approval provided for assistance. Patient appreciative for assistance provided and apologetic for her outburst stating "this is not like me."   Final next level of care: Home/Self Care Barriers to Discharge: No Barriers Identified   Patient Goals and CMS Choice Patient states their goals for this hospitalization and ongoing recovery are:: feel better and not hurt   Choice offered to / list presented to : NA  Discharge Placement                       Discharge Plan and Services In-house Referral: Financial Counselor, Clinical Social Work Discharge Planning Services: CM Consult Post Acute Care Choice: NA          DME Arranged: N/A DME Agency: NA       HH Arranged: NA HH Agency: NA        Social Determinants of Health (SDOH) Interventions     Readmission Risk Interventions No flowsheet data found.

## 2019-04-25 NOTE — Progress Notes (Signed)
Sarah Burch to be discharged home per MD order. Discussed prescriptions and follow up appointments with the patient. Prescriptions given to patient; medication list explained in detail. Patient verbalized understanding.  Skin clean, dry and intact without evidence of skin break down, no evidence of skin tears noted. IV catheter discontinued intact. Site without signs and symptoms of complications. Dressing and pressure applied. Pt denies pain at the site currently. No complaints noted.  Patient free of lines, drains, and wounds.   An After Visit Summary (AVS) was printed and given to the patient. Patient escorted via wheelchair, and discharged home via private auto.  Baldo Ash, RN

## 2019-04-25 NOTE — Discharge Summary (Addendum)
Physician Discharge Summary  MISCHELL BRANFORD ZOX:[id number] DOB: 15-Mar-1971 DOA: 04/17/2019  PCP: Patient, No Pcp Per  Admit date: 04/17/2019 Discharge date: 04/25/2019  Admitted From: Home Disposition: Home Recommendations for Outpatient Follow-up:  1. Follow up with PCP in 1-2 weeks 2. Please obtain BMP/CBC in one week 3. Please follow up with Dr. Chales Abrahams GI  Home Health none Equipment/Devices: None Discharge Condition stable CODE STATUS: Full code Diet recommendation bland soft diet no fried or fatty food cardiac diet Brief/Interim Summary: 48 year old female with history of chronic pancreatitis, GERD, hypertension, tobacco abuse, marijuana abuse who presents the emergency department due to worsening epigastric pain, nausea,inability to tolerate oral diet. CT abdomen/pelvis done on presentation showed diffusely edematous pancreas compatible with acute bronchitis, scattered calcification at pancreatic tail, 2small fluid collections and also showedsmall thrombus in thesuperior mesenteric vein. MRI confirmedpancreatitis with multiple pancreatic pseudocyst.GI following for pancreatitis. Prolonged hospitalization due to slow improvement in the abdominal pain, tolerance of diet.Started on Eliquis for mesenteric vein thrombosis.   Discharge Diagnoses:  Principal Problem:   Acute pancreatitis Active Problems:   GERD (gastroesophageal reflux disease)   Acute on chronic pancreatitis (HCC)   Hypokalemia   Essential hypertension   Protein calorie malnutrition (HCC)   Marijuana abuse   Tobacco abuse   Asymptomatic bacteriuria   Macrocytosis   Pseudocyst of pancreas   Hypoalbuminemia   Mesenteric vein thrombosis (HCC)   Protein-calorie malnutrition, severe   Acute cystitis without hematuria    #1 acute on chronic pancreatitis with small pseudocyst and SMV thrombus-the etiology of her pancreatitis is unclear.  She denies use of regular alcohol however she states she is a social  drinker.  Triglyceride levels are normal.  Gallstones present by ultrasound 02/04/2019.  However no evidence of gallbladder or ductal issues present on MRCP. Continue Creon. Continue apixaban twice a day for total of 3 months. Hypercoagulable work-up is also been sent out. Autoimmune pancreatitis work-up has been sent which includes IgG4, CA-19-9, fecal pancreatic elastase. CA-19-9 is 73 which is high normal being up to 35. IgG4 is 42 is normal Follow-up with GI on discharge  #2 microcytic anemia with normal B12 and folic acid.  #3 asymptomatic bacteriuria no treatment indicated  #4 severe protein calorie malnutrition on supplements BMI of 18.3.  #5 multiple electrolyte abnormalities hypokalemia and hypomagnesemia   #6 tobacco abuse counseled against cessation    Nutrition Problem: Severe Malnutrition Etiology: chronic illness(chronic pancreatitis)    Signs/Symptoms: energy intake < 75% for > or equal to 1 month, severe fat depletion, severe muscle depletion     Interventions: Ensure Enlive (each supplement provides 350kcal and 20 grams of protein), Boost Breeze, MVI  Estimated body mass index is 18.47 kg/m as calculated from the following:   Height as of this encounter:  (1.499 m).   Weight as of this encounter: 41.5 kg.  Discharge Instructions  Discharge Instructions    Call MD for:  difficulty breathing, headache or visual disturbances   Complete by: As directed    Call MD for:  persistant nausea and vomiting   Complete by: As directed    Call MD for:  severe uncontrolled pain   Complete by: As directed    Diet - low sodium heart healthy   Complete by: As directed    Increase activity slowly   Complete by: As directed      Allergies as of 04/25/2019      Reactions   Codeine Other (See Comments)   Cause dizziness  Medication List    STOP taking these medications   omeprazole 20 MG capsule Commonly known as: PRILOSEC     TAKE these  medications   acetaminophen 325 MG tablet Commonly known as: TYLENOL Take 650 mg by mouth every 6 (six) hours as needed for headache (pain).   amLODipine 10 MG tablet Commonly known as: NORVASC Take 1 tablet (10 mg total) by mouth daily.   apixaban 5 MG Tabs tablet Commonly known as: ELIQUIS Take 2 tablets (10 mg total) by mouth 2 (two) times daily for 2 days.   apixaban 5 MG Tabs tablet Commonly known as: ELIQUIS Take 1 tablet (5 mg total) by mouth 2 (two) times daily. Start taking on: April 28, 2019   multivitamin with minerals Tabs tablet Take 1 tablet by mouth daily. Start taking on: April 26, 2019   ondansetron 4 MG tablet Commonly known as: ZOFRAN Take 1 tablet (4 mg total) by mouth every 6 (six) hours as needed for nausea. What changed:   medication strength  how much to take  when to take this  reasons to take this   oxyCODONE 5 MG immediate release tablet Commonly known as: Oxy IR/ROXICODONE Take 1 tablet (5 mg total) by mouth every 4 (four) hours as needed for moderate pain.   Pancrelipase (Lip-Prot-Amyl) 24000-76000 units Cpep Take 1 capsule (24,000 Units total) by mouth 3 (three) times daily before meals.   pantoprazole 40 MG tablet Commonly known as: PROTONIX Take 1 tablet (40 mg total) by mouth 2 (two) times daily before a meal.      Follow-up Information    Lynann Bologna, MD Follow up.   Specialties: Gastroenterology, Internal Medicine Contact information: [address] [place] [address] [phone]          Allergies  Allergen Reactions  . Codeine Other (See Comments)    Cause dizziness    Consultations:  Lab our GI   Procedures/Studies: Ct Abdomen Pelvis W Contrast  Result Date: 04/17/2019 CLINICAL DATA:  Acute generalized upper abdominal pain for 1 month with nausea and vomiting since yesterday afternoon, history of pancreatitis EXAM: CT ABDOMEN AND PELVIS WITH CONTRAST TECHNIQUE:  Multidetector CT imaging of the abdomen and pelvis was performed using the standard protocol following bolus administration of intravenous contrast. Sagittal and coronal MPR images reconstructed from axial data set. CONTRAST:  90mL OMNIPAQUE IOHEXOL 300 MG/ML SOLN IV. No oral contrast. COMPARISON:  None FINDINGS: Lower chest: Lung bases clear Hepatobiliary: Gallbladder and liver normal appearance Pancreas: Diffusely edematous pancreas compatible with acute pancreatitis. Lack of enhancement of an atrophic pancreatic body segment, question pancreatic necrosis though this could be acute or old. Scattered calcifications at pancreatic tail, few calcifications at head and uncinate process. Small fluid collection at uncinate process consistent with pseudo cyst, 18 x 14 mm image 30. Additional small less well-defined fluid collection at head/body 8 x 6 mm. Edema of duodenal wall. Infiltrative changes extend to the LEFT medial margin of the gallbladder, surrounding duodenum, and extend into lesser sac. Spleen: Normal appearance.  Small adjacent splenule. Adrenals/Urinary Tract: Adrenal glands normal appearance. Tiny nonobstructing LEFT renal calculi. Kidneys, ureters, and bladder otherwise unremarkable. Stomach/Bowel: Normal appendix. Mild edema adjacent multiple small bowel loops without definite wall thickening. Minimal wall thickening of gastric antrum. Stomach otherwise decompressed. Vascular/Lymphatic: Atherosclerotic calcifications aorta and iliac arteries without aneurysm. Splenic and portal vein patent. 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 Reproductive: Unremarkable uterus and RIGHT ovary. Dominant follicle  LEFT ovary 2.2 cm diameter. Small amount of air in vagina. Other: Small amount of free fluid in pelvis. No free air. No hernia. Musculoskeletal: Degenerative changes of the RIGHT hip joint. Grade 1 anterolisthesis L4-L5 secondary to facet degenerative changes.  IMPRESSION: Changes of acute on chronic calcific pancreatitis with significant peripancreatic edema. Two small fluid collections are seen, 18 x 14 mm at uncinate process and 8 x 6 mm at head/body junction, consistent with pseudo cysts. Atrophic segment of pancreatic body with absent enhancement, concerning for pancreatic necrosis though this may potentially be the sequela of prior pancreatitis rather than acute necrosis due to the relative atrophy. 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 Findings called to Dr. Fredirick Maudlinies on 04/17/2019 at 1336 hrs. Electronically Signed   By: Ulyses SouthwardMark  Boles M.D.   On: 04/17/2019 13:38   Mr Abdomen Mrcp Vivien RossettiW Wo Contast  Result Date: 04/20/2019 CLINICAL DATA:  48 year old female with suspected pancreatitis. Upper abdominal pain. EXAM: MRI ABDOMEN WITHOUT AND WITH CONTRAST (INCLUDING MRCP) TECHNIQUE: Multiplanar multisequence MR imaging of the abdomen was performed both before and after the administration of intravenous contrast. Heavily T2-weighted images of the biliary and pancreatic ducts were obtained, and three-dimensional MRCP images were rendered by post processing. CONTRAST:  4mL GADAVIST GADOBUTROL 1 MMOL/ML IV SOLN COMPARISON:  No prior abdominal MRI. CT the abdomen and pelvis 04/17/2019. FINDINGS: Lower chest: Unremarkable. Hepatobiliary: No suspicious cystic or solid hepatic lesions. No intra or extrahepatic biliary ductal dilatation noted on MRCP images. Gallbladder is normal in appearance. Pancreas: Unfortunately, due to the extent of active inflammation, trace amount of peripancreatic fluid, and motion related artifact on today's examination, several of the sequences poorly evaluated the pancreas, including MRCP imaging which is essentially nondiagnostic. With these limitations in mind there are several small pancreatic fluid collections, largest of which is in the posterior aspect of the pancreatic head measuring 1.8 x 1.4 x 2.7 cm (axial image 22 of series 3 and  coronal image 20 of series 4) demonstrating mild T1 hyperintensity, T2 hyperintensity and no significant internal enhancement, most compatible with a mildly hemorrhagic pancreatic pseudocyst. Several additional small nonenhancing areas are also noted in the adjacent pancreatic parenchyma best appreciated on delayed imaging (series 1304), but poorly evaluated on other pulse sequences, presumably additional small pancreatic pseudocysts. One of these collections appears to extend anteriorly and to the right, potentially involving the anterior wall of the second portion of the duodenum (axial image 38 of series 1304). These are predominantly in the head of the pancreas, although there is an additional small lesion in the tail of the pancreas (axial image 22 of series 1304) measuring 1 cm, and there are several small collections which appear to extend into the gastrosplenic ligament best appreciated on coronal image 28 of series 14, all likely to represent small pancreatic pseudocysts. Pancreatic parenchyma otherwise appears to enhance normally. Spleen:  Unremarkable. Adrenals/Urinary Tract: Bilateral kidneys and adrenal glands are normal in appearance. No hydroureteronephrosis in the visualized portions of the abdomen. Stomach/Bowel: Probable extension of one of the pancreatic pseudocysts to involve the anterior wall of the second portion of the duodenum, as discussed above. Vascular/Lymphatic: Aortic atherosclerosis. No aneurysm identified in the visualized abdominal vasculature. Filling defect in the superior mesenteric vein, compatible with nonocclusive thrombus (axial image 33 of series 1302). No lymphadenopathy identified in the abdomen. Other: Trace volume of fluid around the pancreas. Trace volume of ascites. Musculoskeletal: No aggressive appearing osseous lesions are noted in the visualized portions of the skeleton. IMPRESSION:  1. Acute pancreatitis with multiple pancreatic pseudocysts, as detailed above. 2.  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 3. Although portions of the MRCP imaging is considered nondiagnostic, there is no evidence of intrahepatic or extrahepatic biliary ductal dilatation, and no definite pancreatic ductal dilatation. Electronically Signed   By: Vinnie Langton M.D.   On: 04/20/2019 19:27    (Echo, Carotid, EGD, Colonoscopy, ERCP)    Subjective: Patient anxious to go home tolerated p.o. intake though she is still on soft food does have some nausea and abdominal pain  Discharge Exam: Vitals:   04/25/19 0809 04/25/19 0947  BP: (!) 141/78 137/85  Pulse: 91 87  Resp: 18   Temp: 98.1 F (36.7 C)   SpO2: 99%    Vitals:   04/24/19 2055 04/25/19 0530 04/25/19 0809 04/25/19 0947  BP: (!) 150/89 (!) 147/94 (!) 141/78 137/85  Pulse: 99 87 91 87  Resp: 18 18 18    Temp: 98.9 F (37.2 C) 98.4 F (36.9 C) 98.1 F (36.7 C)   TempSrc: Oral Oral Oral   SpO2: 98% 97% 99%   Weight: 41.5 kg     Height:        General: Pt is alert, awake, not in acute distress Cardiovascular: RRR, S1/S2 +, no rubs, no gallops Respiratory: CTA bilaterally, no wheezing, no rhonchi Abdominal: Soft, NT, ND, bowel sounds + Extremities: no edema, no cyanosis    The results of significant diagnostics from this hospitalization (including imaging, microbiology, ancillary and laboratory) are listed below for reference.     Microbiology: Recent Results (from the past 240 hour(s))  Urine culture     Status: Abnormal   Collection Time: 04/17/19 11:24 AM   Specimen: Urine, Random  Result Value Ref Range Status   Specimen Description URINE, RANDOM  Final   Special Requests   Final    NONE Performed at [facility], [address] [place], [place] [address]    Culture >=100,000 COLONIES/mL ESCHERICHIA COLI (A)  Final   Report Status 04/19/2019 FINAL  Final   Organism ID, Bacteria ESCHERICHIA COLI (A)  Final      Susceptibility   Escherichia coli - MIC*     AMPICILLIN >=32 RESISTANT Resistant     CEFAZOLIN 8 SENSITIVE Sensitive     CEFTRIAXONE <=1 SENSITIVE Sensitive     CIPROFLOXACIN <=0.25 SENSITIVE Sensitive     GENTAMICIN >=16 RESISTANT Resistant     IMIPENEM <=0.25 SENSITIVE Sensitive     NITROFURANTOIN <=16 SENSITIVE Sensitive     TRIMETH/SULFA >=320 RESISTANT Resistant     AMPICILLIN/SULBACTAM >=32 RESISTANT Resistant     PIP/TAZO 8 SENSITIVE Sensitive     Extended ESBL NEGATIVE Sensitive     * >=100,000 COLONIES/mL ESCHERICHIA COLI  SARS CORONAVIRUS 2 (TAT 6-24 HRS) Nasopharyngeal Nasopharyngeal Swab     Status: None   Collection Time: 04/17/19  1:52 PM   Specimen: Nasopharyngeal Swab  Result Value Ref Range Status   SARS Coronavirus 2 NEGATIVE NEGATIVE Final    Comment: (NOTE) SARS-CoV-2 target nucleic acids are NOT DETECTED. The SARS-CoV-2 RNA is generally detectable in upper and lower respiratory specimens during the acute phase of infection. Negative results do not preclude SARS-CoV-2 infection, do not rule out co-infections with other pathogens, and should not be used as the sole basis for treatment or other patient management decisions. Negative results must be combined with clinical observations, patient history, and epidemiological information. The expected result is Negative. Fact Sheet for Patients: SugarRoll.be  Fact Sheet for Healthcare Providers: quierodirigir.com This test is not yet approved or cleared by the [place] FDA and  has been authorized for detection and/or diagnosis of SARS-CoV-2 by FDA under an Emergency Use Authorization (EUA). This EUA will remain  in effect (meaning this test can be used) for the duration of the COVID-19 declaration under Section 56 4(b)(1) of the Act, 21 U.S.C. section 360bbb-3(b)(1), unless the authorization is terminated or revoked sooner. Performed at [facility], [address], [place], [place] [address]       Labs: BNP (last 3 results) No results for input(s): BNP in the last 8760 hours. Basic Metabolic Panel: Recent Labs  Lab 04/19/19 0358  04/21/19 0329 04/22/19 0529 04/23/19 0415 04/24/19 0345 04/25/19 0414  NA 137   < > 139 140 140 141 141  K 3.8   < > 4.4 3.4* 3.3* 4.0 4.0  CL 111   < > 113* 110 108 113* 108  CO2 17*   < > 19* GLUCOSE 69*   < > 168* 133* 89 90 107*  BUN <5*   < > <5* <5* <5* <5* <5*  CREATININE 0.70   < > 0.63 0.63 0.61 0.66 0.64  CALCIUM 8.4*   < > 8.5* 8.4* 8.5* 8.8* 9.0  MG 1.5*   < > 1.4* 1.6* 1.9 1.6* 1.7  PHOS 2.6  --  3.4 3.0 3.3 3.0  --    < > = values in this interval not displayed.   Liver Function Tests: Recent Labs  Lab 04/25/19 0414  AST 22  ALT 12  ALKPHOS 75  BILITOT 0.3  PROT 5.9*  ALBUMIN 2.5*   Recent Labs  Lab 04/25/19 0414  LIPASE 41   No results for input(s): AMMONIA in the last 168 hours. CBC: Recent Labs  Lab 04/21/19 0329 04/22/19 0529 04/23/19 0415 04/24/19 0345 04/25/19 0414  WBC 8.6 8.6 9.9 8.3 8.2  HGB 9.5* 9.4* 9.9* 9.2* 10.1*  HCT 29.1* 28.1* 29.5* 28.4* 29.4*  MCV 104.7* 103.3* 102.8* 105.6* 103.2*  PLT 232 247 278 256 293   Cardiac Enzymes: No results for input(s): CKTOTAL, CKMB, CKMBINDEX, TROPONINI in the last 168 hours. BNP: Invalid input(s): POCBNP CBG: No results for input(s): GLUCAP in the last 168 hours. D-Dimer No results for input(s): DDIMER in the last 72 hours. Hgb A1c No results for input(s): HGBA1C in the last 72 hours. Lipid Profile No results for input(s): CHOL, HDL, LDLCALC, TRIG, CHOLHDL, LDLDIRECT in the last 72 hours. Thyroid function studies No results for input(s): TSH, T4TOTAL, T3FREE, THYROIDAB in the last 72 hours.  Invalid input(s): FREET3 Anemia work up No results for input(s): VITAMINB12, FOLATE, FERRITIN, TIBC, IRON, RETICCTPCT in the last 72 hours. Urinalysis    Component Value Date/Time   COLORURINE YELLOW 04/17/2019 1123   APPEARANCEUR HAZY  (A) 04/17/2019 1123   LABSPEC 1.014 04/17/2019 1123   PHURINE 6.0 04/17/2019 1123   GLUCOSEU NEGATIVE 04/17/2019 1123   HGBUR NEGATIVE 04/17/2019 1123   BILIRUBINUR NEGATIVE 04/17/2019 1123   KETONESUR 20 (A) 04/17/2019 1123   PROTEINUR NEGATIVE 04/17/2019 1123   NITRITE POSITIVE (A) 04/17/2019 1123   LEUKOCYTESUR TRACE (A) 04/17/2019 1123   Sepsis Labs Invalid input(s): PROCALCITONIN,  WBC,  LACTICIDVEN Microbiology Recent Results (from the past 240 hour(s))  Urine culture     Status: Abnormal   Collection Time: 04/17/19 11:24 AM   Specimen: Urine, Random  Result Value Ref Range Status  Specimen Description URINE, RANDOM  Final   Special Requests   Final    NONE Performed at [facility], [address], [place], [place] [address]    Culture >=100,000 COLONIES/mL ESCHERICHIA COLI (A)  Final   Report Status 04/19/2019 FINAL  Final   Organism ID, Bacteria ESCHERICHIA COLI (A)  Final      Susceptibility   Escherichia coli - MIC*    AMPICILLIN >=32 RESISTANT Resistant     CEFAZOLIN 8 SENSITIVE Sensitive     CEFTRIAXONE <=1 SENSITIVE Sensitive     CIPROFLOXACIN <=0.25 SENSITIVE Sensitive     GENTAMICIN >=16 RESISTANT Resistant     IMIPENEM <=0.25 SENSITIVE Sensitive     NITROFURANTOIN <=16 SENSITIVE Sensitive     TRIMETH/SULFA >=320 RESISTANT Resistant     AMPICILLIN/SULBACTAM >=32 RESISTANT Resistant     PIP/TAZO 8 SENSITIVE Sensitive     Extended ESBL NEGATIVE Sensitive     * >=100,000 COLONIES/mL ESCHERICHIA COLI  SARS CORONAVIRUS 2 (TAT 6-24 HRS) Nasopharyngeal Nasopharyngeal Swab     Status: None   Collection Time: 04/17/19  1:52 PM   Specimen: Nasopharyngeal Swab  Result Value Ref Range Status   SARS Coronavirus 2 NEGATIVE NEGATIVE Final    Comment: (NOTE) SARS-CoV-2 target nucleic acids are NOT DETECTED. The SARS-CoV-2 RNA is generally detectable in upper and lower respiratory specimens during the acute phase of infection. Negative results do not preclude  SARS-CoV-2 infection, do not rule out co-infections with other pathogens, and should not be used as the sole basis for treatment or other patient management decisions. Negative results must be combined with clinical observations, patient history, and epidemiological information. The expected result is Negative. Fact Sheet for Patients: HairSlick.no Fact Sheet for Healthcare Providers: quierodirigir.com This test is not yet approved or cleared by the [place] FDA and  has been authorized for detection and/or diagnosis of SARS-CoV-2 by FDA under an Emergency Use Authorization (EUA). This EUA will remain  in effect (meaning this test can be used) for the duration of the COVID-19 declaration under Section 56 4(b)(1) of the Act, 21 U.S.C. section 360bbb-3(b)(1), unless the authorization is terminated or revoked sooner. Performed at [facility], [address] [place], [place] [address]      Time coordinating discharge:  39 minutes  SIGNED:   Alwyn Ren, MD  [facility] 04/25/2019, 10:14 AM Pager   If 7PM-7AM, please contact night-coverage [email or web address] Password [email or web address]

## 2019-04-25 NOTE — Plan of Care (Signed)
  Problem: Education: Goal: Knowledge of General Education information will improve Description: Including pain rating scale, medication(s)/side effects and non-pharmacologic comfort measures Outcome: Adequate for Discharge   Problem: Health Behavior/Discharge Planning: Goal: Ability to manage health-related needs will improve Outcome: Adequate for Discharge   Problem: Clinical Measurements: Goal: Ability to maintain clinical measurements within normal limits will improve 04/25/2019 1051 by Baldo Ash, RN Outcome: Adequate for Discharge 04/25/2019 0832 by Baldo Ash, RN Outcome: Progressing Goal: Will remain free from infection Outcome: Adequate for Discharge Goal: Diagnostic test results will improve Outcome: Adequate for Discharge Goal: Respiratory complications will improve Outcome: Adequate for Discharge Goal: Cardiovascular complication will be avoided Outcome: Adequate for Discharge   Problem: Activity: Goal: Risk for activity intolerance will decrease Outcome: Adequate for Discharge   Problem: Nutrition: Goal: Adequate nutrition will be maintained Outcome: Adequate for Discharge   Problem: Coping: Goal: Level of anxiety will decrease Outcome: Adequate for Discharge   Problem: Elimination: Goal: Will not experience complications related to bowel motility Outcome: Adequate for Discharge Goal: Will not experience complications related to urinary retention Outcome: Adequate for Discharge   Problem: Pain Managment: Goal: General experience of comfort will improve Outcome: Adequate for Discharge   Problem: Safety: Goal: Ability to remain free from injury will improve Outcome: Adequate for Discharge   Problem: Skin Integrity: Goal: Risk for impaired skin integrity will decrease Outcome: Adequate for Discharge   Problem: Malnutrition  (NI-5.2) Goal: Food and/or nutrient delivery Description: Individualized approach for food/nutrient  provision. Outcome: Adequate for Discharge

## 2019-04-25 NOTE — Plan of Care (Signed)
  Problem: Clinical Measurements: Goal: Ability to maintain clinical measurements within normal limits will improve Outcome: Progressing   

## 2019-04-30 LAB — PANCREATIC ELASTASE, FECAL: Pancreatic Elastase-1, Stool: 148 ug Elast./g — ABNORMAL LOW (ref >200)

## 2019-05-07 ENCOUNTER — Emergency Department (HOSPITAL_COMMUNITY)
Admission: EM | Admit: 2019-05-07 | Discharge: 2019-05-07 | Disposition: A | Discharge status: Home / Self Care | Payer: Self-pay | Attending: Emergency Medicine | Admitting: Emergency Medicine

## 2019-05-07 ENCOUNTER — Encounter (HOSPITAL_COMMUNITY): Payer: Self-pay | Admitting: Emergency Medicine

## 2019-05-07 ENCOUNTER — Other Ambulatory Visit: Payer: Self-pay

## 2019-05-07 DIAGNOSIS — F17210 Nicotine dependence, cigarettes, uncomplicated: Secondary | ICD-10-CM | POA: Insufficient documentation

## 2019-05-07 DIAGNOSIS — Z7901 Long term (current) use of anticoagulants: Secondary | ICD-10-CM | POA: Insufficient documentation

## 2019-05-07 DIAGNOSIS — R1013 Epigastric pain: Secondary | ICD-10-CM | POA: Insufficient documentation

## 2019-05-07 DIAGNOSIS — I10 Essential (primary) hypertension: Secondary | ICD-10-CM | POA: Insufficient documentation

## 2019-05-07 DIAGNOSIS — Z79899 Other long term (current) drug therapy: Secondary | ICD-10-CM | POA: Insufficient documentation

## 2019-05-07 LAB — COMPREHENSIVE METABOLIC PANEL
ALT: 11 U/L (ref 0–44)
AST: 21 U/L (ref 15–41)
Albumin: 3.4 g/dL — ABNORMAL LOW (ref 3.5–5.0)
Alkaline Phosphatase: 95 U/L (ref 38–126)
Anion gap: 11 (ref 5–15)
BUN: 7 mg/dL (ref 6–20)
CO2: 21 mmol/L — ABNORMAL LOW (ref 22–32)
Calcium: 9 mg/dL (ref 8.9–10.3)
Chloride: 106 mmol/L (ref 98–111)
Creatinine, Ser: 0.76 mg/dL (ref 0.44–1.00)
GFR calc Af Amer: >60 mL/min (ref >60)
GFR calc non Af Amer: >60 mL/min (ref >60)
Glucose, Bld: 127 mg/dL — ABNORMAL HIGH (ref 70–99)
Potassium: 3.6 mmol/L (ref 3.5–5.1)
Sodium: 138 mmol/L (ref 135–145)
Total Bilirubin: 0.6 mg/dL (ref 0.3–1.2)
Total Protein: 7.1 g/dL (ref 6.5–8.1)

## 2019-05-07 LAB — I-STAT BETA HCG BLOOD, ED (MC, WL, AP ONLY): I-stat hCG, quantitative: <5 m[IU]/mL (ref <5)

## 2019-05-07 LAB — LIPASE, BLOOD: Lipase: 64 U/L — ABNORMAL HIGH (ref 11–51)

## 2019-05-07 LAB — CBC
HCT: 36.3 % (ref 36.0–46.0)
Hemoglobin: 12.2 g/dL (ref 12.0–15.0)
MCH: 33.9 pg (ref 26.0–34.0)
MCHC: 33.6 g/dL (ref 30.0–36.0)
MCV: 100.8 fL — ABNORMAL HIGH (ref 80.0–100.0)
Platelets: 470 10*3/uL — ABNORMAL HIGH (ref 150–400)
RBC: 3.6 MIL/uL — ABNORMAL LOW (ref 3.87–5.11)
RDW: 13.2 % (ref 11.5–15.5)
WBC: 10.1 10*3/uL (ref 4.0–10.5)
nRBC: 0 % (ref 0.0–0.2)

## 2019-05-07 LAB — URINALYSIS, ROUTINE W REFLEX MICROSCOPIC
Bilirubin Urine: NEGATIVE
Glucose, UA: NEGATIVE mg/dL
Hgb urine dipstick: NEGATIVE
Ketones, ur: NEGATIVE mg/dL
Nitrite: NEGATIVE
Protein, ur: 30 mg/dL — AB
Specific Gravity, Urine: 1.017 (ref 1.005–1.030)
pH: 5 (ref 5.0–8.0)

## 2019-05-07 MED ORDER — OXYCODONE HCL 5 MG PO TABS
5.0000 mg | ORAL_TABLET | Freq: Once | ORAL | Status: AC
  Administered 2019-05-07: 14:00:00 5 mg via ORAL
  Filled 2019-05-07: qty 1

## 2019-05-07 MED ORDER — PROMETHAZINE HCL 25 MG RE SUPP: 25.0000 mg | Freq: Four times a day (QID) | RECTAL | 0 refills | Status: DC | PRN

## 2019-05-07 MED ORDER — SODIUM CHLORIDE 0.9% FLUSH: 3.0000 mL | Freq: Once | INTRAVENOUS | Status: DC

## 2019-05-07 MED ORDER — ALUM & MAG HYDROXIDE-SIMETH 200-200-20 MG/5ML PO SUSP
30.0000 mL | Freq: Once | ORAL | Status: AC
  Administered 2019-05-07: 14:00:00 30 mL via ORAL
  Filled 2019-05-07: qty 30

## 2019-05-07 MED ORDER — ONDANSETRON 4 MG PO TBDP
4.0000 mg | ORAL_TABLET | Freq: Once | ORAL | Status: AC
  Administered 2019-05-07: 4 mg via ORAL
  Filled 2019-05-07: qty 1

## 2019-05-07 MED ORDER — ACETAMINOPHEN 500 MG PO TABS
1000.0000 mg | ORAL_TABLET | Freq: Once | ORAL | Status: AC
  Administered 2019-05-07: 14:00:00 1000 mg via ORAL
  Filled 2019-05-07: qty 2

## 2019-05-07 NOTE — Discharge Instructions (Signed)
Try to avoid things that may make this worse, most commonly these are spicy foods tomato based products fatty foods chocolate and peppermint.  Alcohol and tobacco can also make this worse.  Return to the emergency department for sudden worsening pain fever or inability to eat or drink.  

## 2019-05-07 NOTE — ED Triage Notes (Signed)
Pt in with c/o generalized abd pain "ever since released from hospital last week". Recent dx of pancreatitis. Emesis x 3 PTA

## 2019-05-07 NOTE — ED Provider Notes (Signed)
[facility] EMERGENCY DEPARTMENT Provider Note   CSN: [id number] Arrival date & time: 05/07/19  1145     History   Chief Complaint Chief Complaint  Patient presents with  . Abdominal Pain  . Emesis    HPI Sarah Burch is a 48 y.o. female.     48 yo F with a chief complaints of epigastric and left upper quadrant abdominal pain.  Worse with eating.  Going on for the past couple days.  Patient has been taking her Protonix without change.  She used to be a heavy drinker and states now she does drink socially.  Continues to smoke cigarettes but is down to 4 cigarettes a day.  She denies fevers or chills has been vomiting when she eats or drinks.  No diarrhea.  The history is provided by the patient.  Abdominal Pain Pain location:  Epigastric Pain quality: sharp and shooting   Pain radiates to:  Does not radiate Pain severity:  Moderate Onset quality:  Gradual Duration:  2 days Timing:  Constant Progression:  Worsening Chronicity:  Recurrent Context: alcohol use   Relieved by:  Nothing Worsened by:  Nothing Ineffective treatments:  None tried Associated symptoms: vomiting   Associated symptoms: no chest pain, no chills, no dysuria, no fever, no nausea and no shortness of breath   Emesis Associated symptoms: abdominal pain   Associated symptoms: no arthralgias, no chills, no fever, no headaches and no myalgias     Past Medical History:  Diagnosis Date  . Chronic GERD   . Hypertension   . Marijuana abuse   . Pancreatitis   . Tobacco abuse     Patient Active Problem List   Diagnosis Date Noted  . Acute cystitis without hematuria   . Protein-calorie malnutrition, severe 04/19/2019  . Hypokalemia 04/17/2019  . Essential hypertension 04/17/2019  . Protein calorie malnutrition (HCC) 04/17/2019  . Marijuana abuse 04/17/2019  . Tobacco abuse 04/17/2019  . Asymptomatic bacteriuria 04/17/2019  . Macrocytosis 04/17/2019  . 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 Hypoalbuminemia 04/17/2019  . 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 Acute pancreatitis 01/26/2019  . GERD (gastroesophageal reflux disease) 01/26/2019  . Acute on chronic pancreatitis (HCC) 01/26/2019    History reviewed. No pertinent surgical history.   OB History   No obstetric history on file.      Home Medications    Prior to Admission medications   Medication Sig Start Date End Date Taking? Authorizing Provider  acetaminophen (TYLENOL) 325 MG tablet Take 650 mg by mouth every 6 (six) hours as needed for headache (pain).    [provider]  amLODipine (NORVASC) 10 MG tablet Take 1 tablet (10 mg total) by mouth daily. 04/25/19 08/23/19  Alwyn Ren, MD  apixaban (ELIQUIS) 5 MG TABS tablet Take 2 tablets (10 mg total) by mouth 2 (two) times daily for 2 days. 04/25/19 04/27/19  Alwyn Ren, MD  apixaban (ELIQUIS) 5 MG TABS tablet Take 1 tablet (5 mg total) by mouth 2 (two) times daily. 04/28/19   Alwyn Ren, MD  lipase/protease/amylase [phone] units CPEP Take 1 capsule (24,000 Units total) by mouth 3 (three) times daily before meals. 04/25/19   Alwyn Ren, MD  Multiple Vitamin (MULTIVITAMIN WITH MINERALS) TABS tablet Take 1 tablet by mouth daily. 04/26/19   Alwyn Ren, MD  ondansetron (ZOFRAN) 4 MG tablet Take 1 tablet (4 mg total) by mouth every 6 (six) hours as needed for nausea. 04/25/19  Georgette Shell, MD  oxyCODONE (OXY IR/ROXICODONE) 5 MG immediate release tablet Take 1 tablet (5 mg total) by mouth every 4 (four) hours as needed for moderate pain. 04/25/19   Georgette Shell, MD  pantoprazole (PROTONIX) 40 MG tablet Take 1 tablet (40 mg total) by mouth 2 (two) times daily before a meal. 04/25/19   Georgette Shell, MD  promethazine (PHENERGAN) 25 MG suppository Place 1 suppository (25 mg total) rectally every 6 (six) hours as needed for nausea or vomiting. 05/07/19   Deno Etienne, DO     Family History No family history on file.  Social History Social History   Tobacco Use  . Smoking status: Current Every Day Smoker  . Smokeless tobacco: Never Used  Substance Use Topics  . Alcohol use: Yes  . Drug use: Never     Allergies   Codeine   Review of Systems Review of Systems  Constitutional: Negative for chills and fever.  HENT: Negative for congestion and rhinorrhea.   Eyes: Negative for redness and visual disturbance.  Respiratory: Negative for shortness of breath and wheezing.   Cardiovascular: Negative for chest pain and palpitations.  Gastrointestinal: Positive for abdominal pain and vomiting. Negative for nausea.  Genitourinary: Negative for dysuria and urgency.  Musculoskeletal: Negative for arthralgias and myalgias.  Skin: Negative for pallor and wound.  Neurological: Negative for dizziness and headaches.     Physical Exam Updated Vital Signs BP (!) 153/98   Pulse 92   Temp 97.9 F (36.6 C) (Oral)   Resp 10   Ht 4\' 11"  (1.499 m)   Wt 41.5 kg   SpO2 99%   BMI 18.48 kg/m   Physical Exam Vitals signs and nursing note reviewed.  Constitutional:      General: She is not in acute distress.    Appearance: She is well-developed. She is not diaphoretic.  HENT:     Head: Normocephalic and atraumatic.  Eyes:     Pupils: Pupils are equal, round, and reactive to light.  Neck:     Musculoskeletal: Normal range of motion and neck supple.  Cardiovascular:     Rate and Rhythm: Normal rate and regular rhythm.     Heart sounds: No murmur. No friction rub. No gallop.   Pulmonary:     Effort: Pulmonary effort is normal.     Breath sounds: No wheezing or rales.  Abdominal:     General: There is no distension.     Palpations: Abdomen is soft.     Tenderness: There is abdominal tenderness.     Comments: Epigastric pain that is distractible.  Musculoskeletal:        General: No tenderness.  Skin:    General: Skin is warm and dry.  Neurological:      Mental Status: She is alert and oriented to person, place, and time.  Psychiatric:        Behavior: Behavior normal.      ED Treatments / Results  Labs (all labs ordered are listed, but only abnormal results are displayed) Labs Reviewed  LIPASE, BLOOD - Abnormal; Notable for the following components:      Result Value   Lipase 64 (*)    All other components within normal limits  COMPREHENSIVE METABOLIC PANEL - Abnormal; Notable for the following components:   CO2 21 (*)    Glucose, Bld 127 (*)    Albumin 3.4 (*)    All other components within normal limits  CBC - Abnormal; Notable  for the following components:   RBC 3.60 (*)    MCV 100.8 (*)    Platelets 470 (*)    All other components within normal limits  URINALYSIS, ROUTINE W REFLEX MICROSCOPIC - Abnormal; Notable for the following components:   APPearance HAZY (*)    Protein, ur 30 (*)    Leukocytes,Ua TRACE (*)    Bacteria, UA RARE (*)    All other components within normal limits  I-STAT BETA HCG BLOOD, ED ([facility], [facility], [facility] ONLY)    EKG None  Radiology No results found.  Procedures Procedures (including critical care time) Discussed smoking cessation with patient and was they were offerred resources to help stop.  Total time was 5 min CPT code 24097.   Medications Ordered in ED Medications  sodium chloride flush (NS) 0.9 % injection 3 mL (3 mLs Intravenous Not Given 05/07/19 1323)  alum & mag hydroxide-simeth (MAALOX/MYLANTA) 200-200-20 MG/5ML suspension 30 mL (30 mLs Oral Given 05/07/19 1353)  ondansetron (ZOFRAN-ODT) disintegrating tablet 4 mg (4 mg Oral Given 05/07/19 1351)  oxyCODONE (Oxy IR/ROXICODONE) immediate release tablet 5 mg (5 mg Oral Given 05/07/19 1352)  acetaminophen (TYLENOL) tablet 1,000 mg (1,000 mg Oral Given 05/07/19 1352)     Initial Impression / Assessment and Plan / ED Course  I have reviewed the triage vital signs and the nursing notes.  Pertinent labs & imaging results that were  available during my care of the patient were reviewed by me and considered in my medical decision making (see chart for details).        48 yo F with a chief complaints of recurrence of her abdominal pain.  The patient was just in the hospital for an acute on chronic pancreatitis with SMV thrombosis.  Was started on Eliquis for that.  Patient has had some continued upper abdominal pain.  She is well-appearing and nontoxic for me on exam.  She appears chronically malnourished.  She was tachycardic on arrival but this resolved spontaneously prior to obtaining any medications.  She has a nonfocal abdominal exam.  Lab work appears unchanged.  Very mildly elevated lipase, not anemic no leukocytosis no LFT elevation.  She was able to tolerate by mouth without difficulty for me.  We will have her follow-up with her doctor, she has an appointment tomorrow.  She also has an appointment soon with GI for an upper endoscopy.  We will have her continue her Protonix, counseled to continue to decrease her smoking and alcohol intake.  3:25 PM:  I have discussed the diagnosis/risks/treatment options with the patient and believe the pt to be eligible for discharge home to follow-up with PCP, GI. We also discussed returning to the ED immediately if new or worsening sx occur. We discussed the sx which are most concerning (e.g., sudden worsening pain, fever, inability to tolerate by mouth) that necessitate immediate return. Medications administered to the patient during their visit and any new prescriptions provided to the patient are listed below.  Medications given during this visit Medications  sodium chloride flush (NS) 0.9 % injection 3 mL (3 mLs Intravenous Not Given 05/07/19 1323)  alum & mag hydroxide-simeth (MAALOX/MYLANTA) 200-200-20 MG/5ML suspension 30 mL (30 mLs Oral Given 05/07/19 1353)  ondansetron (ZOFRAN-ODT) disintegrating tablet 4 mg (4 mg Oral Given 05/07/19 1351)  oxyCODONE (Oxy IR/ROXICODONE)  immediate release tablet 5 mg (5 mg Oral Given 05/07/19 1352)  acetaminophen (TYLENOL) tablet 1,000 mg (1,000 mg Oral Given 05/07/19 1352)     The patient appears  reasonably screen and/or stabilized for discharge and I doubt any other medical condition or other [facility] requiring further screening, evaluation, or treatment in the ED at this time prior to discharge.    Final Clinical Impressions(s) / ED Diagnoses   Final diagnoses:  Epigastric abdominal pain    ED Discharge Orders         Ordered    promethazine (PHENERGAN) 25 MG suppository  Every 6 hours PRN     05/07/19 1404           Melene PlanFloyd, Cece Milhouse, DO 05/07/19 1525

## 2019-05-08 ENCOUNTER — Encounter: Payer: Self-pay | Admitting: Gastroenterology

## 2019-05-08 ENCOUNTER — Ambulatory Visit (INDEPENDENT_AMBULATORY_CARE_PROVIDER_SITE_OTHER): Payer: Self-pay | Admitting: Primary Care

## 2019-05-08 DIAGNOSIS — R1013 Epigastric pain: Secondary | ICD-10-CM

## 2019-05-08 DIAGNOSIS — Z7689 Persons encountering health services in other specified circumstances: Secondary | ICD-10-CM

## 2019-05-08 DIAGNOSIS — R109 Unspecified abdominal pain: Secondary | ICD-10-CM

## 2019-05-08 DIAGNOSIS — Z09 Encounter for follow-up examination after completed treatment for conditions other than malignant neoplasm: Secondary | ICD-10-CM

## 2019-05-08 DIAGNOSIS — R110 Nausea: Secondary | ICD-10-CM

## 2019-05-08 NOTE — Progress Notes (Signed)
Virtual Visit via Telephone Note  I connected with Sarah Burch on 05/08/19 at  2:30 PM EDT by telephone and verified that I am speaking with the correct person using two identifiers.   I discussed the limitations, risks, security and privacy concerns of performing an evaluation and management service by telephone and the availability of in person appointments. I also discussed with the patient that there may be a patient responsible charge related to this service. The patient expressed understanding and agreed to proceed.   History of Present Illness: Sarah Burch is having tele visit for establishment of care and hospital discharge. Patient presented to the emergency room  worsening epigastric pain and nausea.  She reports that her symptoms started last month ago.  She was unable to keep anything down.  She reports decreased appetite and weight loss she is trying to drink Ensure but it is expensive and not affordable. Requesting something for epigastric pain and appetite stimulant. She states the pain medication was to strong and she flushed them down the toliet.   Past Medical History:  Diagnosis Date  . Chronic GERD   . Hypertension   . Marijuana abuse   . Pancreatitis   . Tobacco abuse    Observations/Objective: Review of Systems  Gastrointestinal: Positive for abdominal pain, heartburn, nausea and vomiting.  Neurological: Positive for weakness.  All other systems reviewed and are negative.   Assessment and Plan: Sarah Burch was seen today for hospitalization follow-up.  Diagnoses and all orders for this visit:  Establishing care with new doctor, encounter for Juluis Mire, NP-C will be your  (PCP) that will  providing both diagnosed and undiagnosed health concern as well as continuing care of varied medical conditions, not limited by cause, organ system, or diagnosis. Patient voiced understanding.  Hospital discharge follow-up Advised to follow-up with PCP, GI. Admitted for   acute on chronic pancreatitis with small pseudocyst and SMV thrombus-the etiology of her pancreatitis is unclear. Gallstones present by ultrasound 02/04/2019. There was no evidence of gallbladder or ductal issues present on MRCP.   Abdominal pain, unspecified abdominal location Secondary to pancreatitis previous history of alcohol abuse but now an occasional drinker. Unable to treat pain. Advise to use tylenol or ibuprofen very limited use. She has a GI follow up with Dr. Lyndel Safe and he can address treatment options for pain.     Follow Up Instructions:    I discussed the assessment and treatment plan with the patient. The patient was provided an opportunity to ask questions and all were answered. The patient agreed with the plan and demonstrated an understanding of the instructions.   The patient was advised to call back or seek an in-person evaluation if the symptoms worsen or if the condition fails to improve as anticipated.  I provided 26 minutes of non-face-to-face time during this encounter. Time included reviewing previous encounters, labs and imaging and answering patients problems and concerns   Kerin Perna, NP

## 2019-05-10 ENCOUNTER — Encounter: Payer: Self-pay | Admitting: Gastroenterology

## 2019-05-16 ENCOUNTER — Telehealth (INDEPENDENT_AMBULATORY_CARE_PROVIDER_SITE_OTHER): Payer: Self-pay | Admitting: Primary Care

## 2019-05-16 ENCOUNTER — Telehealth (INDEPENDENT_AMBULATORY_CARE_PROVIDER_SITE_OTHER): Payer: Self-pay

## 2019-05-16 NOTE — Telephone Encounter (Signed)
Called Ms. Balch she is in a lot of abdominal pain on stated she threw narcotics because they made her too drowsy. Advised to call Dr. Lyndel Safe patient agreed

## 2019-05-16 NOTE — Telephone Encounter (Signed)
Patient called to schedule her follow up. Patient states she has some concerns and would like for either the nurse or PCP to give a call back.   Please advice [phone]  Thank you Whitney Post

## 2019-05-16 NOTE — Telephone Encounter (Signed)
CMA spoke with patient she states she is still having stomach pain, feels like burning, pain radiates to left side. Unable to eat. She has been dizzy for the last three days. She is taking medications as directed and following dietary orders. Please advise. Nat Christen, CMA

## 2019-05-30 ENCOUNTER — Ambulatory Visit: Payer: Self-pay | Admitting: Gastroenterology

## 2019-05-31 MED FILL — PANTOPRAZOLE SOD DR 40 MG T: 40 | 30 days supply | Qty: 60 | Fill #0

## 2019-05-31 MED FILL — AMLODIPINE BESYLATE 10 MG T: 10 | 30 days supply | Qty: 30 | Fill #0

## 2019-06-05 ENCOUNTER — Other Ambulatory Visit: Payer: Self-pay

## 2019-06-05 ENCOUNTER — Encounter (INDEPENDENT_AMBULATORY_CARE_PROVIDER_SITE_OTHER): Payer: Self-pay | Admitting: Primary Care

## 2019-06-05 ENCOUNTER — Ambulatory Visit (INDEPENDENT_AMBULATORY_CARE_PROVIDER_SITE_OTHER): Payer: Self-pay | Admitting: Primary Care

## 2019-06-05 VITALS — BP 129/81 | HR 103 | Temp 97.4°F | Resp 16 | Ht 59.0 in | Wt 85.8 lb

## 2019-06-05 DIAGNOSIS — Z09 Encounter for follow-up examination after completed treatment for conditions other than malignant neoplasm: Secondary | ICD-10-CM

## 2019-06-05 DIAGNOSIS — R109 Unspecified abdominal pain: Secondary | ICD-10-CM

## 2019-06-05 DIAGNOSIS — R110 Nausea: Secondary | ICD-10-CM

## 2019-06-05 DIAGNOSIS — Z8719 Personal history of other diseases of the digestive system: Secondary | ICD-10-CM

## 2019-06-05 MED ORDER — AMITRIPTYLINE HCL 25 MG PO TABS: 25.0000 mg | ORAL_TABLET | Freq: Every day | ORAL | 3 refills | Status: DC

## 2019-06-05 MED ORDER — APIXABAN 5 MG PO TABS: 5.0000 mg | ORAL_TABLET | Freq: Two times a day (BID) | ORAL | 0 refills | Status: DC

## 2019-06-05 MED ORDER — ONDANSETRON HCL 4 MG PO TABS: 4.0000 mg | ORAL_TABLET | Freq: Four times a day (QID) | ORAL | 1 refills | Status: AC | PRN

## 2019-06-05 MED ORDER — PANTOPRAZOLE SODIUM 40 MG PO TBEC: 40.0000 mg | DELAYED_RELEASE_TABLET | Freq: Two times a day (BID) | ORAL | 1 refills | Status: DC

## 2019-06-05 MED ORDER — ZENPEP 25000-79000 UNITS PO CPEP: 2500.0000 [IU] | ORAL_CAPSULE | Freq: Three times a day (TID) | ORAL | 0 refills | Status: DC

## 2019-06-05 MED FILL — AMITRIPTYLINE HCL 25 MG TAB: 25 | 30 days supply | Qty: 30 | Fill #0

## 2019-06-05 MED FILL — ONDANSETRON HCL 4 MG TABLET: 4 | 10 days supply | Qty: 30 | Fill #0

## 2019-06-05 MED FILL — !ZENPEP 25000-79000 UNIT CA: 25000-79000 | 30 days supply | Qty: 90 | Fill #0

## 2019-06-05 MED FILL — !ELIQUIS 5MG TABLET: 5 | 30 days supply | Qty: 60 | Fill #0

## 2019-06-05 NOTE — Patient Instructions (Addendum)
Pancreatitis Eating Plan Pancreatitis is when your pancreas becomes irritated and swollen (inflamed). The pancreas is a small organ located behind your stomach. It helps your body digest food and regulate your blood sugar. Pancreatitis can affect how your body digests food, especially foods with fat. You may also have other symptoms such as abdominal pain or nausea. When you have pancreatitis, following a low-fat eating plan may help you manage symptoms and recover more quickly. Work with your health care provider or a diet and nutrition specialist (dietitian) to create an eating plan that is right for you. What are tips for following this plan? Reading food labels Use the information on food labels to help keep track of how much fat you eat:  Check the serving size.  Look for the amount of total fat in grams (g) in one serving. ? Low-fat foods have 3 g of fat or less per serving. ? Fat-free foods have 0.5 g of fat or less per serving.  Keep track of how much fat you eat based on how many servings you eat. ? For example, if you eat two servings, the amount of fat you eat will be two times what is listed on the label. Shopping   Buy low-fat or nonfat foods, such as: ? Fresh, frozen, or canned fruits and vegetables. ? Grains, including pasta, bread, and rice. ? Lean meat, poultry, fish, and other protein foods. ? Low-fat or nonfat dairy.  Avoid buying bakery products and other sweets made with whole milk, butter, and eggs.  Avoid buying snack foods with added fat, such as anything with butter or cheese flavoring. Cooking  Remove skin from poultry, and remove extra fat from meat.  Limit the amount of fat and oil you use to 6 teaspoons or less per day.  Cook using low-fat methods, such as boiling, broiling, grilling, steaming, or baking.  Use spray oil to cook. Add fat-free chicken broth to add flavor and moisture.  Avoid adding cream to thicken soups or sauces. Use other thickeners  such as corn starch or tomato paste. Meal planning   Eat a low-fat diet as told by your dietitian. For most people, this means having no more than 55-65 grams of fat each day.  Eat small, frequent meals throughout the day. For example, you may have 5-6 small meals instead of 3 large meals.  Drink enough fluid to keep your urine pale yellow.  Do not drink alcohol. Talk to your health care provider if you need help stopping.  Limit how much caffeine you have, including black coffee, black and green tea, caffeinated soft drinks, and energy drinks. General information  Let your health care provider or dietitian know if you have unplanned weight loss on this eating plan.  You may be instructed to follow a clear liquid diet during a flare of symptoms. Talk with your health care provider about how to manage your diet during symptoms of a flare.  Take any vitamins or supplements as told by your health care provider.  Work with a dietitian, especially if you have other conditions such as obesity or diabetes mellitus. What foods should I avoid? Fruits Fried fruits. Fruits served with butter or cream. Vegetables Fried vegetables. Vegetables cooked with butter, cheese, or cream. Grains Biscuits, waffles, donuts, pastries, and croissants. Pies and cookies. Butter-flavored popcorn. Regular crackers. Meats and other protein foods Fatty cuts of meat. Poultry with skin. Organ meats. Bacon, sausage, and cold cuts. Whole eggs. Nuts and nut butters. Dairy Whole and   2% milk. Whole milk yogurt. Whole milk ice cream. Cream and half-and-half. Cream cheese. Sour cream. Cheese. Beverages Wine, beer, and liquor. The items listed above may not be a complete list of foods and beverages to avoid. Contact a dietitian for more information. Summary  Pancreatitis can affect how your body digests food, especially foods with fat.  When you have pancreatitis, it is recommended that you follow a low-fat eating  plan to help you recover more quickly and manage symptoms. For most people, this means limiting fat to no more than 55-65 grams per day.  Do not drink alcohol. Limit the amount of caffeine you have, and drink enough fluid to keep your urine pale yellow. This information is not intended to replace advice given to you by your health care provider. Make sure you discuss any questions you have with your health care provider. Document Released: 10/10/2017 Document Revised: 10/25/2018 Document Reviewed: 10/10/2017 [employer] Patient Education  2020 [employer]  

## 2019-06-05 NOTE — Progress Notes (Signed)
Established Patient Office Visit  Subjective:  Patient ID: Sarah Burch, female    DOB: Jun 19, 1971  Age: 48 y.o. MRN: [id number]  CC: No chief complaint on file.   HPI Sarah Burch presents for abdominal pain history of pancreatitis and has a follow up with GI on 06/27/2019. Today I will complete forms for Eloquis and creon  . [facility] with the assistance of the clinical pharmacist and Eloquis is available.     Past Medical History:  Diagnosis Date  . Chronic GERD   . Hypertension   . Marijuana abuse   . Pancreatitis   . Tobacco abuse     History reviewed. No pertinent surgical history.  History reviewed. No pertinent family history.  Social History   Socioeconomic History  . Marital status: Divorced    Spouse name: Not on file  . Number of children: Not on file  . Years of education: Not on file  . Highest education level: Not on file  Occupational History  . Not on file  Social Needs  . Financial resource strain: Not on file  . Food insecurity    Worry: Not on file    Inability: Not on file  . Transportation needs    Medical: Not on file    Non-medical: Not on file  Tobacco Use  . Smoking status: Current Every Day Smoker    Types: Cigarettes  . Smokeless tobacco: Never Used  . Tobacco comment: 4 cig/day  Substance and Sexual Activity  . Alcohol use: Not Currently  . Drug use: Never  . Sexual activity: Not on file  Lifestyle  . Physical activity    Days per week: Not on file    Minutes per session: Not on file  . Stress: Not on file  Relationships  . Social Musicianconnections    Talks on phone: Not on file    Gets together: Not on file    Attends religious service: Not on file    Active member of club or organization: Not on file    Attends meetings of clubs or organizations: Not on file    Relationship status: Not on file  . Intimate partner violence    Fear of current or ex partner: Not  on file    Emotionally abused: Not on file    Physically abused: Not on file    Forced sexual activity: Not on file  Other Topics Concern  . Not on file  Social History Narrative  . Not on file    Outpatient Medications Prior to Visit  Medication Sig Dispense Refill  . acetaminophen (TYLENOL) 325 MG tablet Take 650 mg by mouth every 6 (six) hours as needed for headache (pain).    Marland Kitchen. amLODipine (NORVASC) 10 MG tablet Take 1 tablet (10 mg total) by mouth daily. 30 tablet 3  . Multiple Vitamin (MULTIVITAMIN WITH MINERALS) TABS tablet Take 1 tablet by mouth daily.    . pantoprazole (PROTONIX) 40 MG tablet Take 1 tablet (40 mg total) by mouth 2 (two) times daily before a meal. 40 tablet 1  . lipase/protease/amylase 24000-76000 units CPEP Take 1 capsule (24,000 Units total) by mouth 3 (three) times daily before meals. (Patient not taking: Reported on 06/05/2019) 270 capsule 1  . oxyCODONE (OXY IR/ROXICODONE) 5 MG immediate release tablet Take 1 tablet (5 mg total) by mouth every 4 (four) hours as needed for moderate pain. (Patient not taking: Reported on 06/05/2019)  30 tablet 0  . promethazine (PHENERGAN) 25 MG suppository Place 1 suppository (25 mg total) rectally every 6 (six) hours as needed for nausea or vomiting. (Patient not taking: Reported on 06/05/2019) 12 each 0  . apixaban (ELIQUIS) 5 MG TABS tablet Take 2 tablets (10 mg total) by mouth 2 (two) times daily for 2 days. 8 tablet 0  . apixaban (ELIQUIS) 5 MG TABS tablet Take 1 tablet (5 mg total) by mouth 2 (two) times daily. (Patient not taking: Reported on 06/05/2019) 60 tablet 1  . ondansetron (ZOFRAN) 4 MG tablet Take 1 tablet (4 mg total) by mouth every 6 (six) hours as needed for nausea. (Patient not taking: Reported on 06/05/2019) 20 tablet 0   No facility-administered medications prior to visit.     Allergies  Allergen Reactions  . Codeine Other (See Comments)    Cause dizziness    ROS Review of Systems  Gastrointestinal:  Positive for abdominal pain and nausea.  All other systems reviewed and are negative.     Objective:    Physical Exam  Constitutional: She is oriented to person, place, and time. She appears well-developed.  Thin built  HENT:  Head: Normocephalic.  Neck: Normal range of motion.  Cardiovascular: Normal rate and regular rhythm.  Pulmonary/Chest: Effort normal and breath sounds normal.  Abdominal: Soft. Bowel sounds are normal.  Musculoskeletal: Normal range of motion.  Neurological: She is oriented to person, place, and time.  Psychiatric: She has a normal mood and affect. Her behavior is normal.    BP 129/81 (BP Location: Left Arm, Patient Position: Sitting, Cuff Size: Small)   Pulse (!) 103   Temp (!) 97.4 F (36.3 C) (Oral)   Resp 16   Ht 4\' 11"  (1.499 m)   Wt 85 lb 12.8 oz (38.9 kg)   SpO2 98%   BMI 17.33 kg/m  Wt Readings from Last 3 Encounters:  06/05/19 85 lb 12.8 oz (38.9 kg)  05/07/19 91 lb 7.9 oz (41.5 kg)  04/24/19 91 lb 7.5 oz (41.5 kg)     Health Maintenance Due  Topic Date Due  . TETANUS/TDAP  01/31/1988  . PAP SMEAR-Modifier  01/30/1990  . MAMMOGRAM  01/31/2019  . COLONOSCOPY  01/31/2019  . INFLUENZA VACCINE  02/16/2019    There are no preventive care reminders to display for this patient.  Lab Results  Component Value Date   TSH 0.869 04/17/2019   Lab Results  Component Value Date   WBC 10.1 05/07/2019   HGB 12.2 05/07/2019   HCT 36.3 05/07/2019   MCV 100.8 (H) 05/07/2019   PLT 470 (H) 05/07/2019   Lab Results  Component Value Date   NA 138 05/07/2019   K 3.6 05/07/2019   CO2 21 (L) 05/07/2019   GLUCOSE 127 (H) 05/07/2019   BUN 7 05/07/2019   CREATININE 0.76 05/07/2019   BILITOT 0.6 05/07/2019   ALKPHOS 95 05/07/2019   AST 21 05/07/2019   ALT 11 05/07/2019   PROT 7.1 05/07/2019   ALBUMIN 3.4 (L) 05/07/2019   CALCIUM 9.0 05/07/2019   ANIONGAP 11 05/07/2019   Lab Results  Component Value Date   CHOL 148 04/17/2019   Lab  Results  Component Value Date   HDL 39 (L) 04/17/2019   Lab Results  Component Value Date   LDLCALC 92 04/17/2019   Lab Results  Component Value Date   TRIG 87 04/17/2019   Lab Results  Component Value Date   CHOLHDL 3.8 04/17/2019  No results found for: HGBA1C    Assessment & Plan:  Diagnoses and all orders for this visit:  Abdominal pain, unspecified abdominal location Patient is diagnosed with acute on chronic pancreatitis with small pseudocyst and SMV thrombus-the etiology of her pancreatitis is unclear. Gallstones present by ultrasound 02/04/2019. However no evidence of gallbladder or ductal issues present on MRCP.Pancrelipase, Lip-Prot-Amyl, (ZENPEP) 25000-79000 units CPEP  substituted for Creon  Lipase/protease/amylase 24000-76000units CPEP paper work started than called patient to have GI to complete form and Eloquis.Marland Kitchen  Hospital discharge follow-up CT abdomen/pelvis done on presentation showed diffusely edematous pancreas compatible with acute bronchitis, scattered calcification at pancreatic tail, 2small fluid collections and also showedsmall thrombus in thesuperior mesenteric vein placed on Eliquis 5mg  bid recommended for 3 months follow up with Dr. (gastrologist )  Other orders -     ondansetron (ZOFRAN) 4 MG tablet; Take 1 tablet (4 mg total) by mouth every 6 (six) hours as needed for nausea. -     pantoprazole (PROTONIX) 40 MG tablet; Take 1 tablet (40 mg total) by mouth 2 (two) times daily before a meal. -     amitriptyline (ELAVIL) 25 MG tablet; Take 1 tablet (25 mg total) by mouth at bedtime. -     Pancrelipase, Lip-Prot-Amyl, (ZENPEP) 25000-79000 units CPEP; Take 2,500 Units by mouth 3 (three) times daily after meals. -     apixaban (ELIQUIS) 5 MG TABS tablet; Take 1 tablet (5 mg total) by mouth 2 (two) times daily.   Meds ordered this encounter  Medications  . ondansetron (ZOFRAN) 4 MG tablet    Sig: Take 1 tablet (4 mg total) by mouth every 6 (six)  hours as needed for nausea.    Dispense:  30 tablet    Refill:  1  . pantoprazole (PROTONIX) 40 MG tablet    Sig: Take 1 tablet (40 mg total) by mouth 2 (two) times daily before a meal.    Dispense:  40 tablet    Refill:  1  . amitriptyline (ELAVIL) 25 MG tablet    Sig: Take 1 tablet (25 mg total) by mouth at bedtime.    Dispense:  30 tablet    Refill:  3  . Pancrelipase, Lip-Prot-Amyl, (ZENPEP) 25000-79000 units CPEP    Sig: Take 2,500 Units by mouth 3 (three) times daily after meals.    Dispense:  90 capsule    Refill:  0  . apixaban (ELIQUIS) 5 MG TABS tablet    Sig: Take 1 tablet (5 mg total) by mouth 2 (two) times daily.    Dispense:  60 tablet    Refill:  0    Follow-up: Return in about 3 months (around 09/05/2019) for Htn.    09/07/2019, NP

## 2019-06-05 NOTE — Progress Notes (Signed)
No pain today.  7/10 epigastric area on yesterday  Out of Eliquis since Thursday. Unsure she will be able to restart this medication.  Not taking nausea medication at this time. Unable to afford.   Please send meds to [facility] pharmacy

## 2019-07-03 ENCOUNTER — Other Ambulatory Visit (INDEPENDENT_AMBULATORY_CARE_PROVIDER_SITE_OTHER): Payer: Self-pay | Admitting: Primary Care

## 2019-07-03 MED FILL — AMLODIPINE BESYLATE 10 MG T: 10 | 30 days supply | Qty: 30 | Fill #1

## 2019-07-03 MED FILL — AMITRIPTYLINE HCL 25 MG TAB: 25 | 30 days supply | Qty: 30 | Fill #1

## 2019-07-03 MED FILL — PANTOPRAZOLE SOD DR 40 MG T: 40 | 20 days supply | Qty: 40 | Fill #0

## 2019-07-03 MED FILL — !ELIQUIS 5MG TABLET: 5 | 30 days supply | Qty: 60 | Fill #0

## 2019-07-03 MED FILL — !ZENPEP 25000-79000 UNIT CA: 25000-79000 | 33 days supply | Qty: 100 | Fill #0

## 2019-08-12 ENCOUNTER — Other Ambulatory Visit (INDEPENDENT_AMBULATORY_CARE_PROVIDER_SITE_OTHER): Payer: Self-pay | Admitting: Family Medicine

## 2019-08-12 MED FILL — PANTOPRAZOLE SOD DR 40 MG T: 40 | 20 days supply | Qty: 40 | Fill #1

## 2019-08-12 MED FILL — ONDANSETRON HCL 4 MG TABLET: 4 | 10 days supply | Qty: 30 | Fill #1

## 2019-08-12 MED FILL — AMLODIPINE BESYLATE 10 MG T: 10 | 30 days supply | Qty: 30 | Fill #2

## 2019-08-12 MED FILL — !ELIQUIS 5MG TABLET: 5 | 30 days supply | Qty: 60 | Fill #1

## 2019-08-12 MED FILL — AMITRIPTYLINE HCL 25 MG TAB: 25 | 30 days supply | Qty: 30 | Fill #2

## 2019-08-13 MED FILL — !ZENPEP 25000-79000 UNIT CA: 25000-79000 | 33 days supply | Qty: 100 | Fill #0

## 2019-08-15 ENCOUNTER — Ambulatory Visit (INDEPENDENT_AMBULATORY_CARE_PROVIDER_SITE_OTHER): Payer: Self-pay | Admitting: Primary Care

## 2019-09-05 ENCOUNTER — Encounter (INDEPENDENT_AMBULATORY_CARE_PROVIDER_SITE_OTHER): Payer: Self-pay | Admitting: Primary Care

## 2019-09-05 ENCOUNTER — Other Ambulatory Visit: Payer: Self-pay

## 2019-09-05 ENCOUNTER — Ambulatory Visit (INDEPENDENT_AMBULATORY_CARE_PROVIDER_SITE_OTHER): Payer: Self-pay | Admitting: Primary Care

## 2019-09-05 DIAGNOSIS — Z760 Encounter for issue of repeat prescription: Secondary | ICD-10-CM

## 2019-09-05 DIAGNOSIS — I10 Essential (primary) hypertension: Secondary | ICD-10-CM

## 2019-09-05 DIAGNOSIS — F5101 Primary insomnia: Secondary | ICD-10-CM

## 2019-09-05 DIAGNOSIS — Z720 Tobacco use: Secondary | ICD-10-CM

## 2019-09-05 MED ORDER — ZENPEP 25000-79000 UNITS PO CPEP: 2500.0000 [IU] | ORAL_CAPSULE | Freq: Three times a day (TID) | ORAL | 1 refills | Status: DC

## 2019-09-05 MED ORDER — APIXABAN 5 MG PO TABS: 5.0000 mg | ORAL_TABLET | Freq: Two times a day (BID) | ORAL | 1 refills | Status: DC

## 2019-09-05 MED ORDER — PANTOPRAZOLE SODIUM 40 MG PO TBEC: 40.0000 mg | DELAYED_RELEASE_TABLET | Freq: Two times a day (BID) | ORAL | 1 refills | Status: DC

## 2019-09-05 MED ORDER — AMITRIPTYLINE HCL 25 MG PO TABS: 25.0000 mg | ORAL_TABLET | Freq: Every day | ORAL | 1 refills | Status: DC

## 2019-09-05 MED ORDER — AMLODIPINE BESYLATE 10 MG PO TABS: 10.0000 mg | ORAL_TABLET | Freq: Every day | ORAL | 1 refills | Status: DC

## 2019-09-05 MED FILL — $ZENPEP 25000-79000 UNIT CA: 25000-79000 | 33 days supply | Qty: 100 | Fill #0

## 2019-09-05 MED FILL — $ELIQUIS 5 MG TABLET: 5 | 30 days supply | Qty: 60 | Fill #0

## 2019-09-05 MED FILL — AMLODIPINE BESYLATE 10 MG T: 10 | 30 days supply | Qty: 30 | Fill #0

## 2019-09-05 MED FILL — PANTOPRAZOLE SOD DR 40 MG T: 40 | 30 days supply | Qty: 60 | Fill #0

## 2019-09-05 NOTE — Progress Notes (Signed)
Pt needs Rx for Bp cuff

## 2019-09-05 NOTE — Progress Notes (Signed)
Virtual Visit via Telephone Note  I connected with Sarah Burch on 09/05/19 at 11:10 AM EST by telephone and verified that I am speaking with the correct person using two identifiers.   I discussed the limitations, risks, security and privacy concerns of performing an evaluation and management service by telephone and the availability of in person appointments. I also discussed with the patient that there may be a patient responsible charge related to this service. The patient expressed understanding and agreed to proceed.   History of Present Illness: Ms. Sarah Burch is having tele visit for blood pressure follow up. She does not have a blood pressure monitor and due to inclement weather office was closed and in person visit. Refilled medication as requested. Doing well on  Creon with chronic pancreatitis managed. She has some maintenance and quality metric due patient verbalized understanding to make follow up to have completed   Past Medical History:  Diagnosis Date  . Chronic GERD   . Hypertension   . Marijuana abuse   . Pancreatitis   . Tobacco abuse    Current Outpatient Medications on File Prior to Visit  Medication Sig Dispense Refill  . acetaminophen (TYLENOL) 325 MG tablet Take 650 mg by mouth every 6 (six) hours as needed for headache (pain).    . Multiple Vitamin (MULTIVITAMIN WITH MINERALS) TABS tablet Take 1 tablet by mouth daily.    . ondansetron (ZOFRAN) 4 MG tablet Take 1 tablet (4 mg total) by mouth every 6 (six) hours as needed for nausea. 30 tablet 1   Observations/Objective: Review of Systems  All other systems reviewed and are negative.  Assessment and Plan: [name] was seen today for hypertension.  Diagnoses and all orders for this visit:  Hypertension, unspecified type -     amLODipine (NORVASC) 10 MG tablet; Take 1 tablet (10 mg total) by mouth daily.  Tobacco abuse Increased risk for lung cancer and other respiratory diseases recommend cessation.  This will  be reminded at each clinical visit.  Primary insomnia Managed with amitriptyline (ELAVIL) 25 MG tablet; Take 1 tablet (25 mg total) by mouth at bedtime.  Other orders/Medication refill -     amitriptyline (ELAVIL) 25 MG tablet; Take 1 tablet (25 mg total) by mouth at bedtime. -     pantoprazole (PROTONIX) 40 MG tablet; Take 1 tablet (40 mg total) by mouth 2 (two) times daily before a meal. -     apixaban (ELIQUIS) 5 MG TABS tablet; Take 1 tablet (5 mg total) by mouth 2 (two) times daily. -     Pancrelipase, Lip-Prot-Amyl, (ZENPEP) 25000-79000 units CPEP; Take 2,500 Units by mouth 3 (three) times daily with meals.    Follow Up Instructions:    I discussed the assessment and treatment plan with the patient. The patient was provided an opportunity to ask questions and all were answered. The patient agreed with the plan and demonstrated an understanding of the instructions.   The patient was advised to call back or seek an in-person evaluation if the symptoms worsen or if the condition fails to improve as anticipated.  I provided 14 minutes of non-face-to-face time during this encounter.   Grayce Sessions, NP

## 2019-09-10 MED FILL — AMITRIPTYLINE HCL 25 MG TAB: 25 | 30 days supply | Qty: 30 | Fill #0

## 2019-10-02 IMAGING — US ULTRASOUND ABDOMEN LIMITED
1 series · 14 of 25 positions shown · non-contrast
Comparison: None.

CLINICAL DATA: RIGHT upper quadrant abdominal pain for 2 weeks.

EXAM:
ULTRASOUND ABDOMEN LIMITED RIGHT UPPER QUADRANT

[Series 1: ultrasound abdomen limited · 14 of 34 slices shown]
[im 1/34]
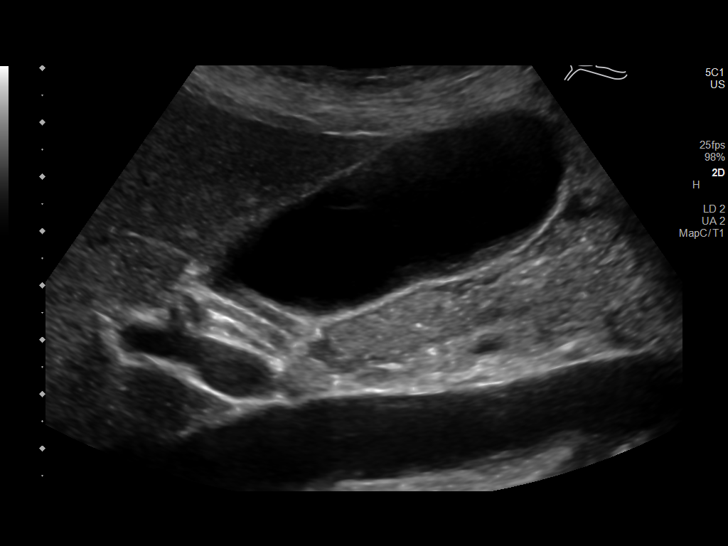
[im 3/34]
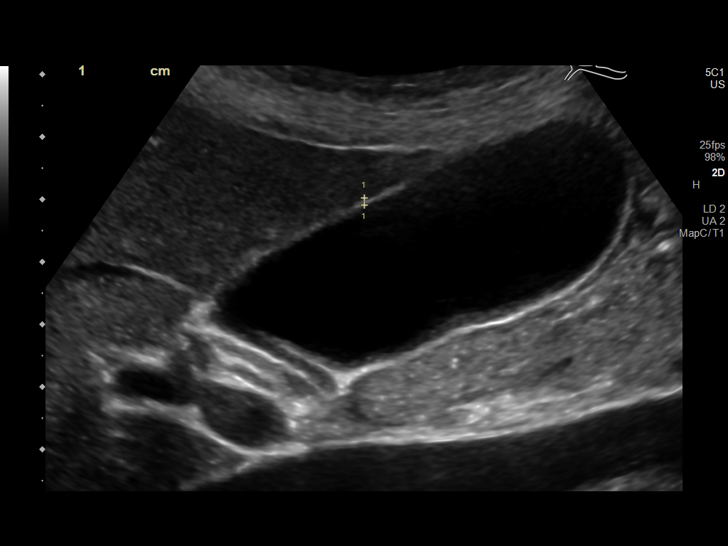
[im 6/34]
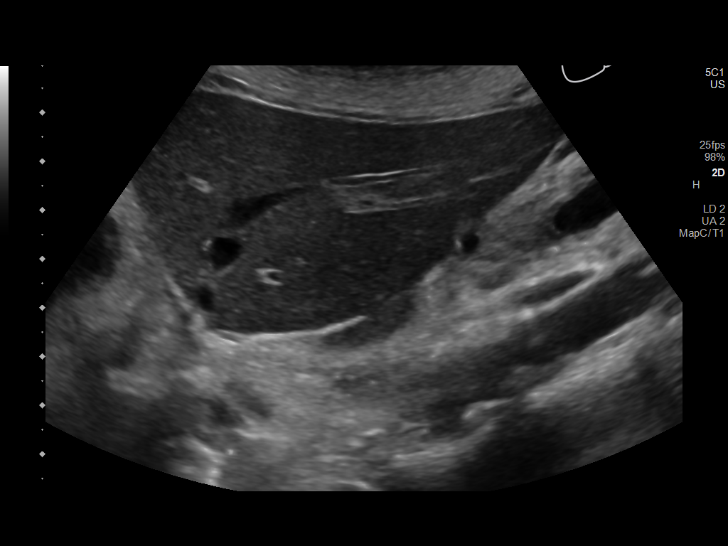
[im 9/34]
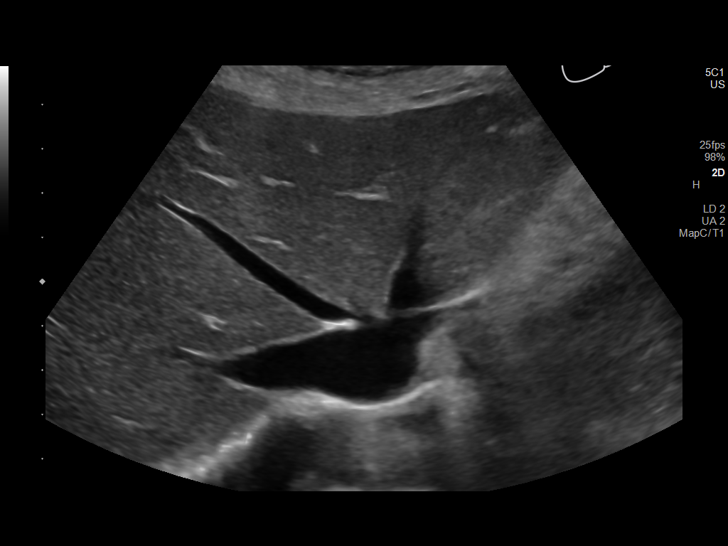
[im 12/34]
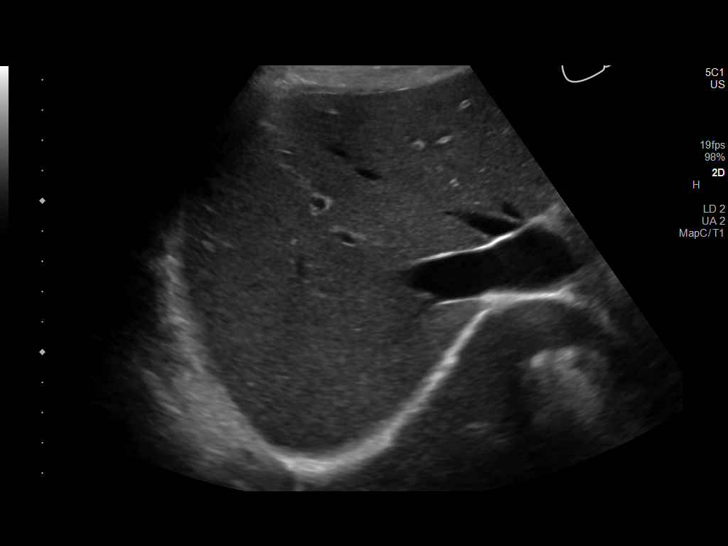
[im 13/34]
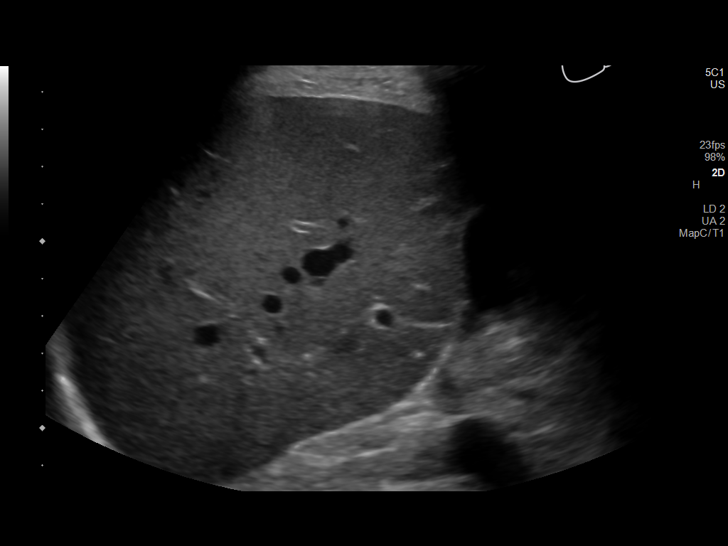
[im 16/34]
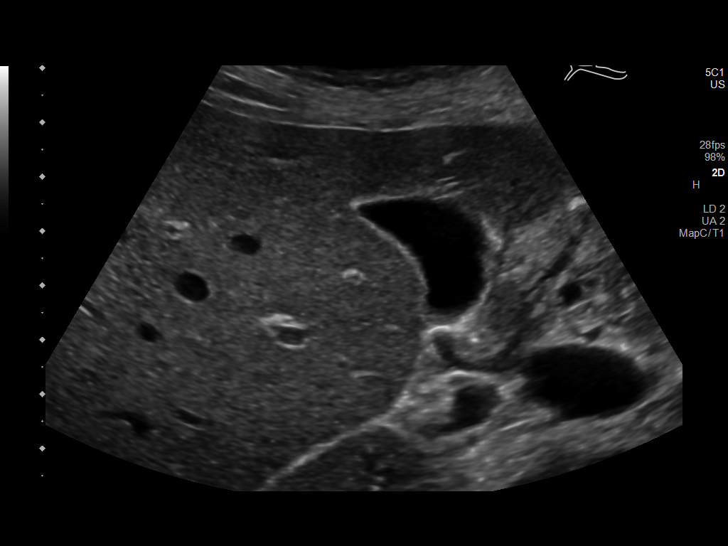
[im 18/34]
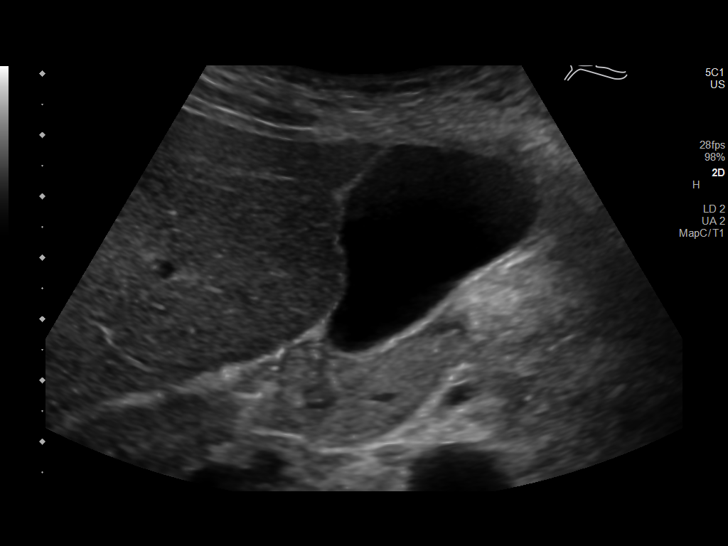
[im 21/34]
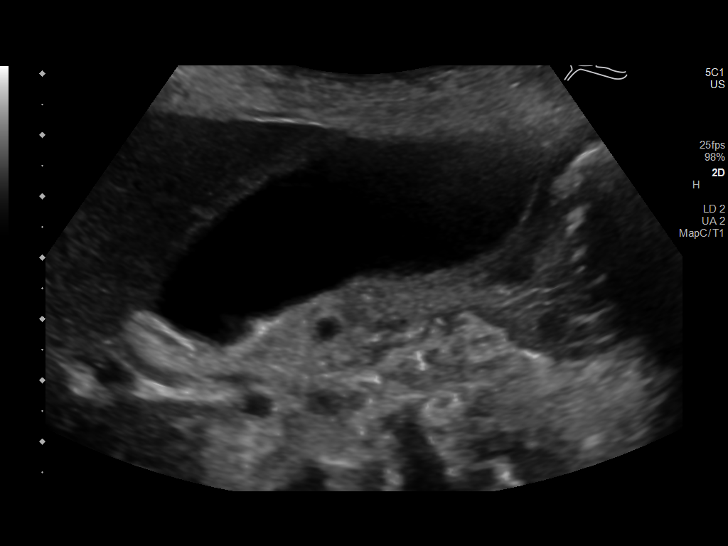
[im 23/34]
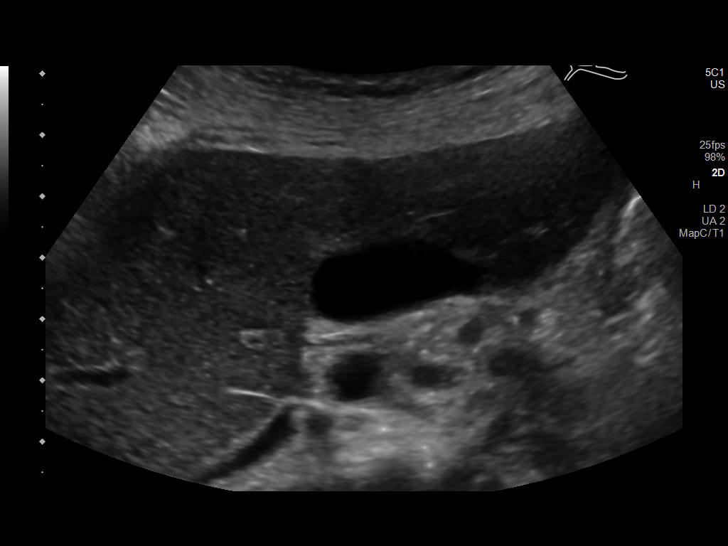
[im 25/34]
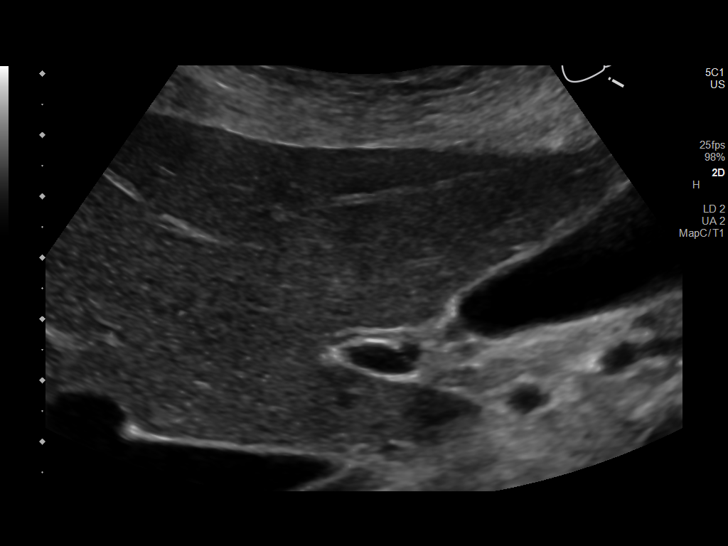
[im 28/34]
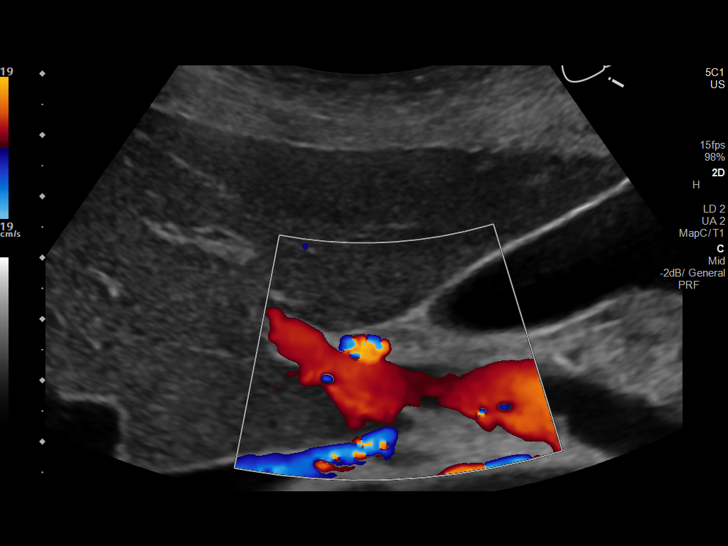
[im 31/34]
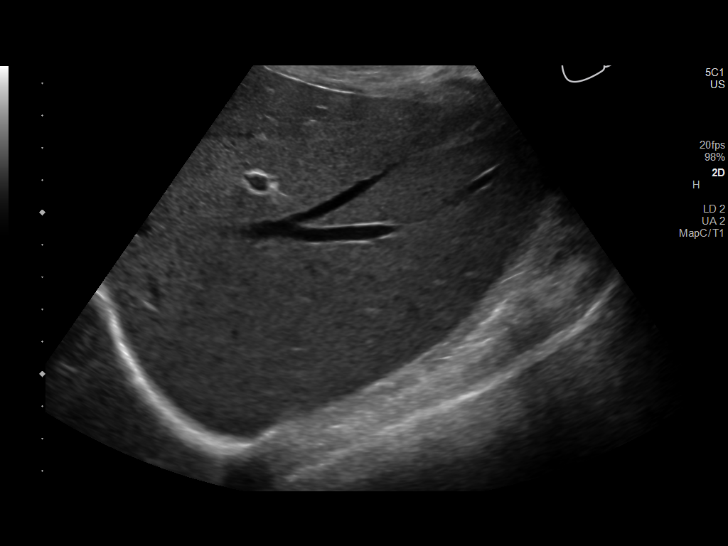
[im 34/34]
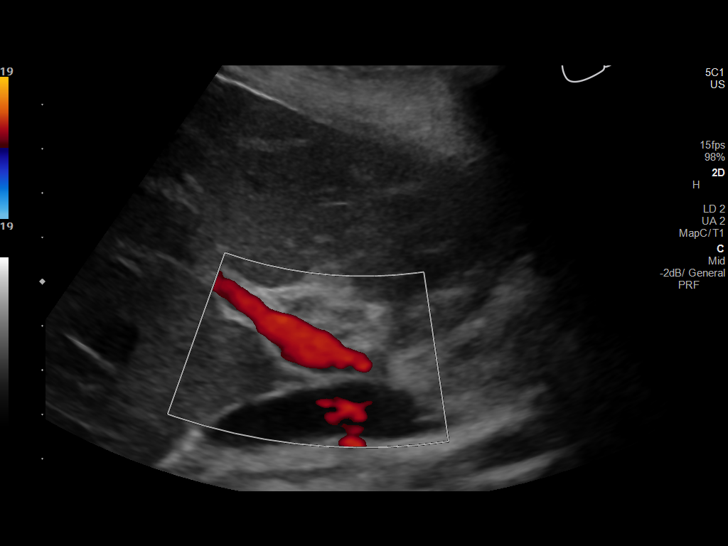

[14 of 25 positions shown; findings below may reference images not displayed]

FINDINGS: Gallbladder:

Small amount of sludge within the gallbladder. No gallstones seen.
No gallbladder wall thickening or pericholecystic fluid. No
sonographic Murphy's sign elicited per the sonographer.

Common bile duct:

Diameter: 1 mm

Liver:

No focal lesion identified. Within normal limits in parenchymal
echogenicity. Portal vein is patent on color Doppler imaging with
normal direction of blood flow towards the liver.
IMPRESSION: 1. No acute findings.  No gallstones.  No evidence of cholecystitis.
2. Small amount of sludge within the gallbladder.
3. Liver appears normal.

## 2019-10-10 MED FILL — PANTOPRAZOLE SOD DR 40 MG T: 40 | 30 days supply | Qty: 60 | Fill #1

## 2019-10-10 MED FILL — AMLODIPINE BESYLATE 10 MG T: 10 | 30 days supply | Qty: 30 | Fill #1

## 2019-10-10 MED FILL — $ZENPEP 25000-79000 UNIT CA: 25000-79000 | 33 days supply | Qty: 100 | Fill #1

## 2019-10-10 MED FILL — $ELIQUIS 5 MG TABLET: 5 | 30 days supply | Qty: 60 | Fill #1

## 2019-10-10 MED FILL — AMITRIPTYLINE HCL 25 MG TAB: 25 | 30 days supply | Qty: 30 | Fill #1

## 2019-11-14 ENCOUNTER — Other Ambulatory Visit (INDEPENDENT_AMBULATORY_CARE_PROVIDER_SITE_OTHER): Payer: Self-pay | Admitting: Primary Care

## 2019-11-14 MED FILL — $ZENPEP 25000-79000 UNIT CA: 25000-79000 | 33 days supply | Qty: 100 | Fill #2

## 2019-11-14 MED FILL — AMITRIPTYLINE HCL 25 MG TAB: 25 | 30 days supply | Qty: 30 | Fill #2

## 2019-11-14 MED FILL — AMLODIPINE BESYLATE 10 MG T: 10 | 30 days supply | Qty: 30 | Fill #2

## 2019-11-14 MED FILL — PANTOPRAZOLE SOD DR 40 MG T: 40 | 30 days supply | Qty: 60 | Fill #2

## 2019-11-14 NOTE — Telephone Encounter (Signed)
Sent to PCP ?

## 2019-11-18 ENCOUNTER — Other Ambulatory Visit (INDEPENDENT_AMBULATORY_CARE_PROVIDER_SITE_OTHER): Payer: Self-pay | Admitting: Primary Care

## 2019-11-19 MED FILL — $ELIQUIS 5 MG TABLET: 5 | 30 days supply | Qty: 60 | Fill #0

## 2019-12-23 IMAGING — CT CT ABD-PELV W/ CM
2 of 5 series · 16 of 46 positions shown, 18 images · IV contrast (Omni 300)
Comparison: None

CLINICAL DATA: Acute generalized upper abdominal pain for 1 month
with nausea and vomiting since yesterday afternoon, history of
pancreatitis

EXAM:
CT ABDOMEN AND PELVIS WITH CONTRAST
TECHNIQUE: Multidetector CT imaging of the abdomen and pelvis was performed
using the standard protocol following bolus administration of
intravenous contrast. Sagittal and coronal MPR images reconstructed
from axial data set.
CONTRAST:  90mL OMNIPAQUE IOHEXOL 300 MG/ML SOLN IV. No oral
contrast.

[Series 3: a/p w/ 5mm · axial · 0.65mm/px · z∈[+699,+1049]mm · 13 of 80 slices shown, 15 images]
[im 5/80  soft-tissue]
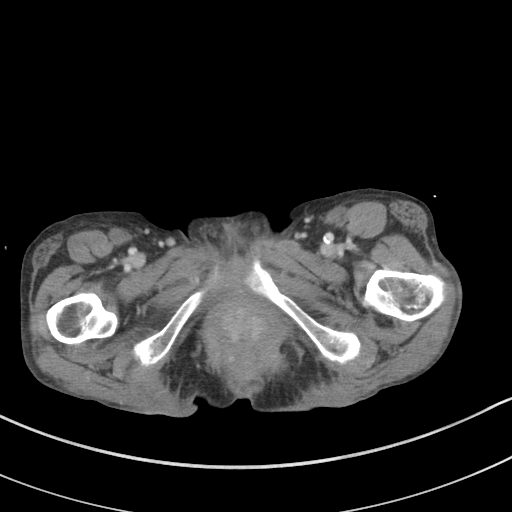
[im 5/80  bone]
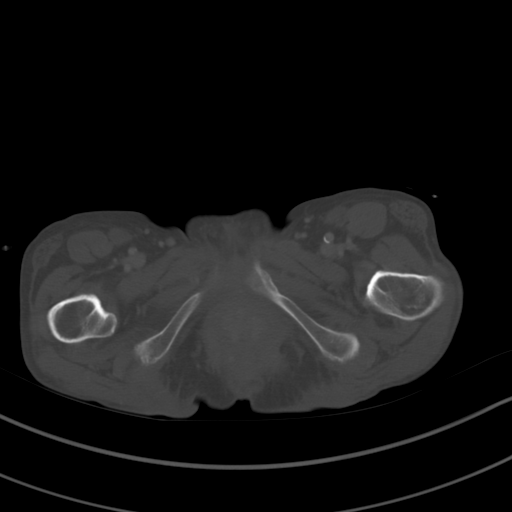
[im 13/80  soft-tissue]
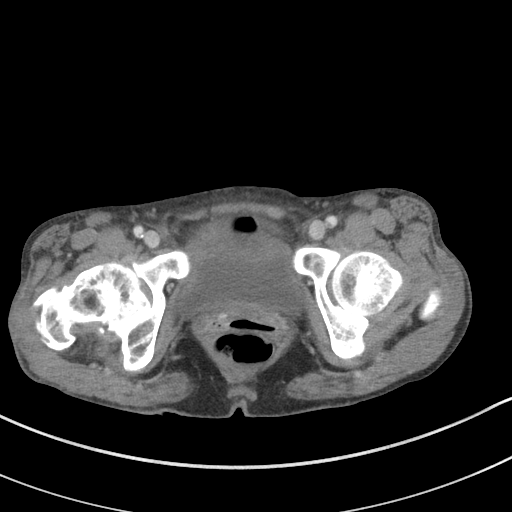
[im 17/80  soft-tissue]
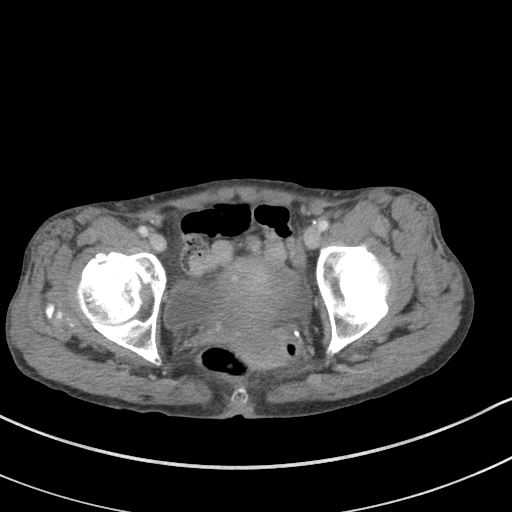
[im 21/80  soft-tissue]
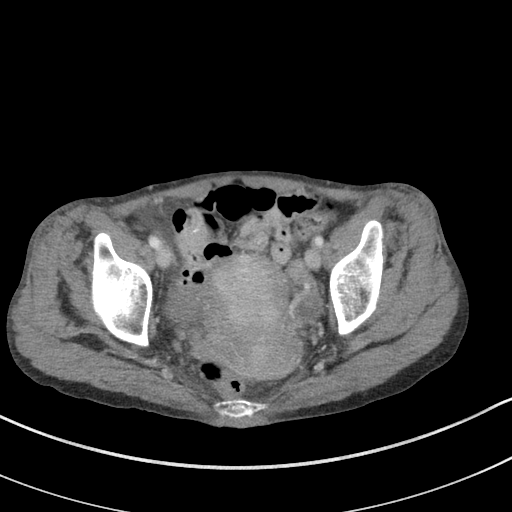
[im 30/80  soft-tissue]
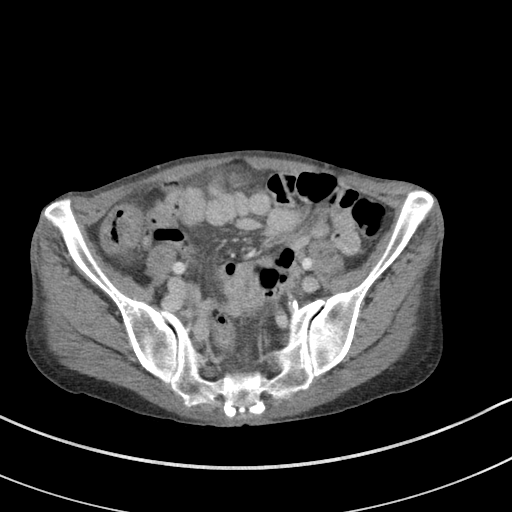
[im 34/80  soft-tissue]
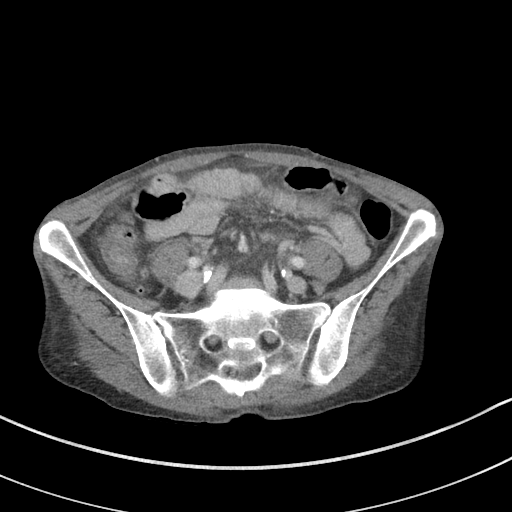
[im 42/80  soft-tissue]
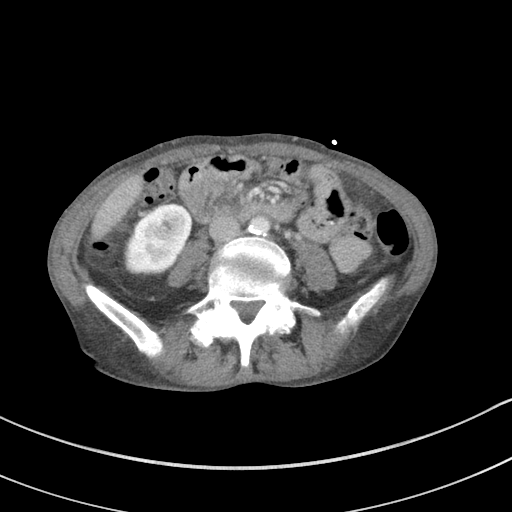
[im 46/80  soft-tissue]
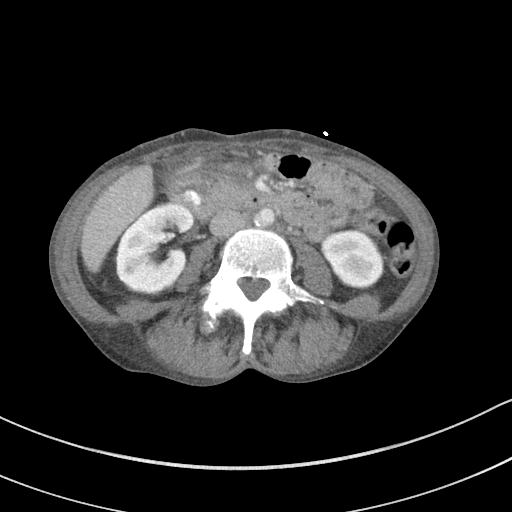
[im 50/80  soft-tissue]
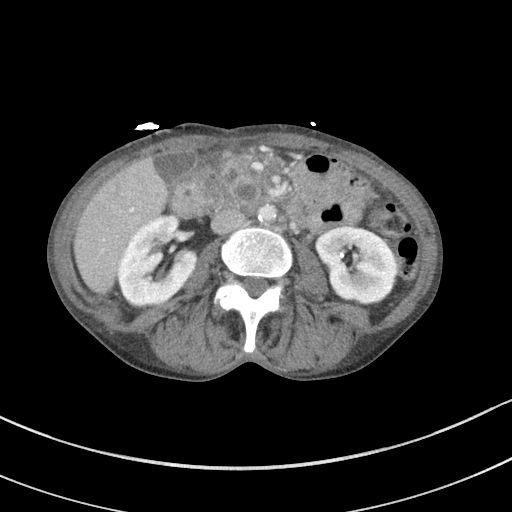
[im 50/80  bone]
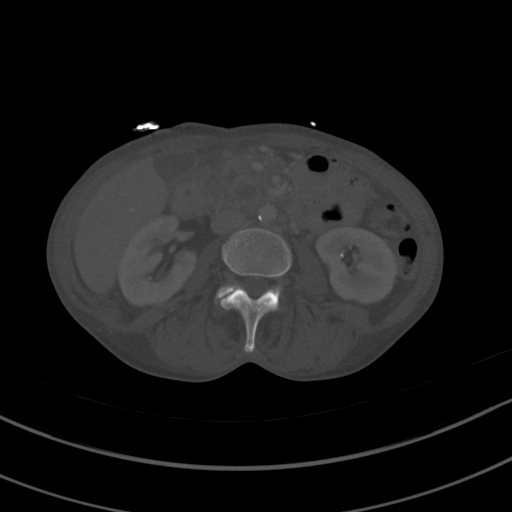
[im 59/80  soft-tissue]
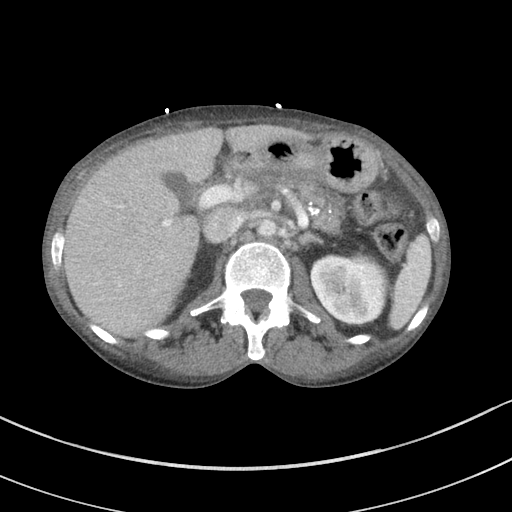
[im 63/80  soft-tissue]
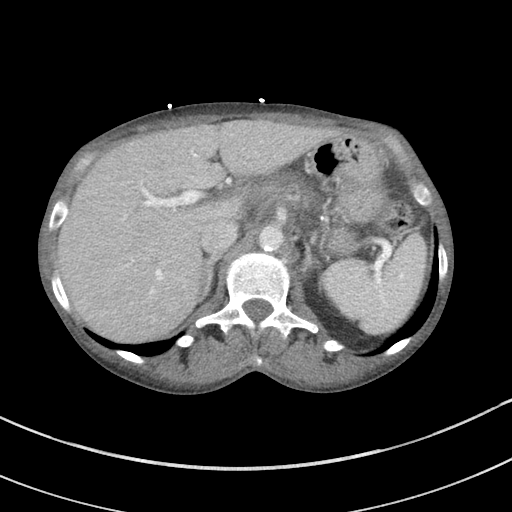
[im 67/80  soft-tissue]
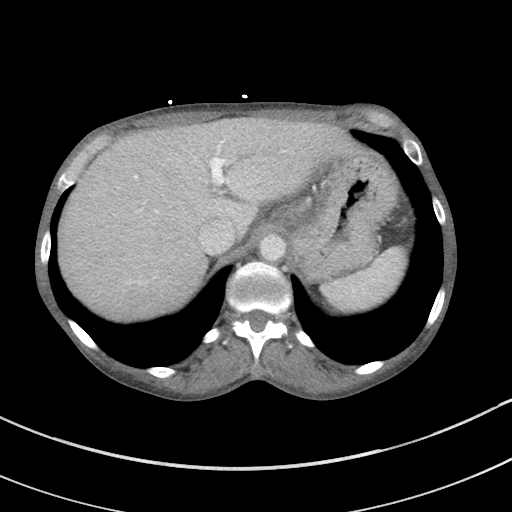
[im 75/80  soft-tissue]
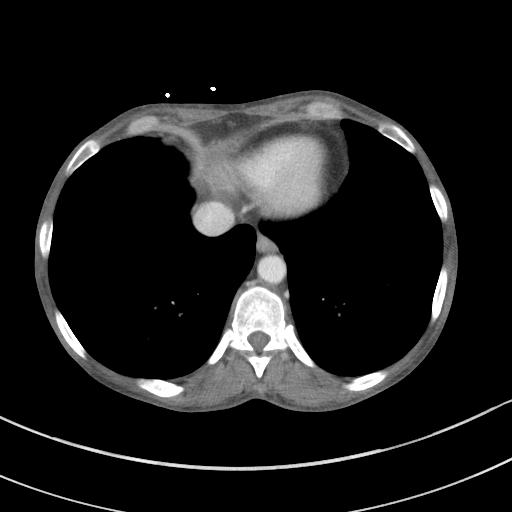

[Series 6: a/p w/ cor · coronal · 0.74mm/px · 3 of 104 slices shown]
[im 35/104  soft-tissue]
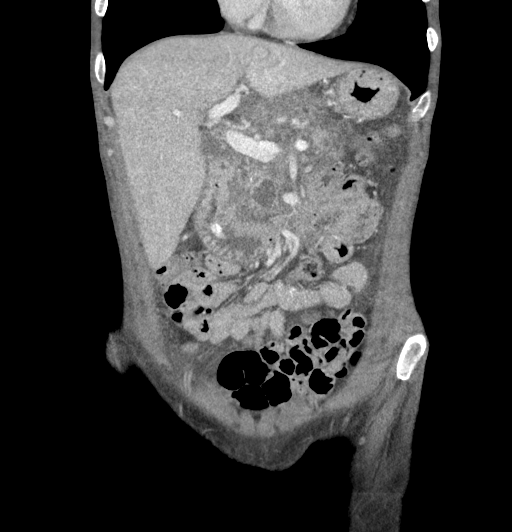
[im 46/104  soft-tissue]
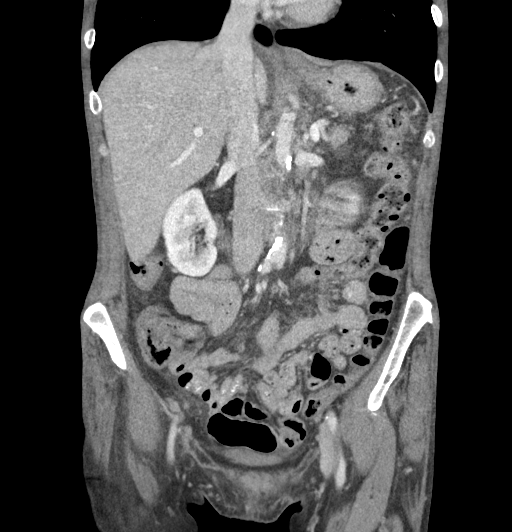
[im 58/104  soft-tissue]
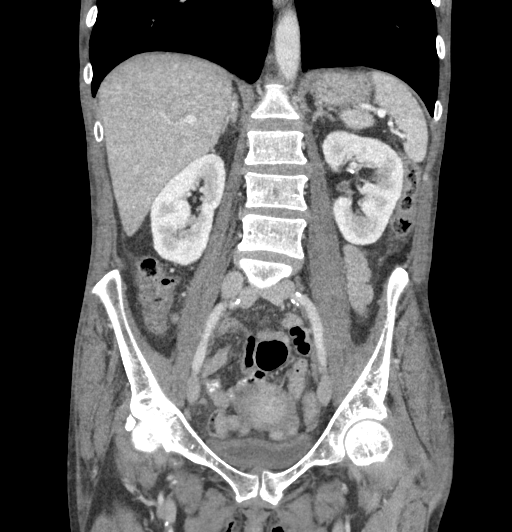

[16 of 46 positions shown; findings below may reference images not displayed]

FINDINGS: Lower chest: Lung bases clear

Hepatobiliary: Gallbladder and liver normal appearance

Pancreas: Diffusely edematous pancreas compatible with acute
pancreatitis. Lack of enhancement of an atrophic pancreatic body
segment, question pancreatic necrosis though this could be acute or
old. Scattered calcifications at pancreatic tail, few calcifications
at head and uncinate process. Small fluid collection at uncinate
process consistent with pseudo cyst, 18 x 14 mm image 30. Additional
small less well-defined fluid collection at head/body 8 x 6 mm.
Edema of duodenal wall. Infiltrative changes extend to the LEFT
medial margin of the gallbladder, surrounding duodenum, and extend
into lesser sac.

Spleen: Normal appearance.  Small adjacent splenule.

Adrenals/Urinary Tract: Adrenal glands normal appearance. Tiny
nonobstructing LEFT renal calculi. Kidneys, ureters, and bladder
otherwise unremarkable.

Stomach/Bowel: Normal appendix. Mild edema adjacent multiple small
bowel loops without definite wall thickening. Minimal wall
thickening of gastric antrum. Stomach otherwise decompressed.

Vascular/Lymphatic: Atherosclerotic calcifications aorta and iliac
arteries without aneurysm. Splenic and portal vein patent. Filling
defect identified within SMV consistent with mesenteric vein
thrombosis.

Reproductive: Unremarkable uterus and RIGHT ovary. Dominant follicle
LEFT ovary 2.2 cm diameter. Small amount of air in vagina.

Other: Small amount of free fluid in pelvis. No free air. No hernia.

Musculoskeletal: Degenerative changes of the RIGHT hip joint. Grade
1 anterolisthesis L4-L5 secondary to facet degenerative changes.
IMPRESSION: Changes of acute on chronic calcific pancreatitis with significant
peripancreatic edema.

Two small fluid collections are seen, 18 x 14 mm at uncinate process
and 8 x 6 mm at head/body junction, consistent with pseudo cysts.

Atrophic segment of pancreatic body with absent enhancement,
concerning for pancreatic necrosis though this may potentially be
the sequela of prior pancreatitis rather than acute necrosis due to
the relative atrophy.

Small thrombus within the superior mesenteric vein.

Findings called to Dr. Lonwabo on 04/17/2019 at 8445 hrs.

## 2020-01-01 MED FILL — AMITRIPTYLINE HCL 25 MG TAB: 25 | 30 days supply | Qty: 30 | Fill #3

## 2020-01-01 MED FILL — $ZENPEP 25000-79000 UNIT CA: 25000-79000 | 66 days supply | Qty: 200 | Fill #3

## 2020-01-01 MED FILL — PANTOPRAZOLE SOD DR 40 MG T: 40 | 30 days supply | Qty: 60 | Fill #3

## 2020-01-01 MED FILL — AMLODIPINE BESYLATE 10 MG T: 10 | 30 days supply | Qty: 30 | Fill #3

## 2020-02-01 ENCOUNTER — Encounter (HOSPITAL_COMMUNITY): Payer: Self-pay | Admitting: Emergency Medicine

## 2020-02-01 ENCOUNTER — Other Ambulatory Visit: Payer: Self-pay

## 2020-02-01 ENCOUNTER — Emergency Department (HOSPITAL_COMMUNITY)
Admission: EM | Admit: 2020-02-01 | Discharge: 2020-02-01 | Disposition: A | Discharge status: Home / Self Care | Payer: Self-pay | Attending: Emergency Medicine | Admitting: Emergency Medicine

## 2020-02-01 DIAGNOSIS — F17210 Nicotine dependence, cigarettes, uncomplicated: Secondary | ICD-10-CM | POA: Insufficient documentation

## 2020-02-01 DIAGNOSIS — R1013 Epigastric pain: Secondary | ICD-10-CM

## 2020-02-01 DIAGNOSIS — Z7901 Long term (current) use of anticoagulants: Secondary | ICD-10-CM | POA: Insufficient documentation

## 2020-02-01 DIAGNOSIS — Z79899 Other long term (current) drug therapy: Secondary | ICD-10-CM | POA: Insufficient documentation

## 2020-02-01 DIAGNOSIS — R1012 Left upper quadrant pain: Secondary | ICD-10-CM | POA: Insufficient documentation

## 2020-02-01 DIAGNOSIS — R1110 Vomiting, unspecified: Secondary | ICD-10-CM | POA: Insufficient documentation

## 2020-02-01 DIAGNOSIS — I10 Essential (primary) hypertension: Secondary | ICD-10-CM | POA: Insufficient documentation

## 2020-02-01 DIAGNOSIS — K852 Alcohol induced acute pancreatitis without necrosis or infection: Secondary | ICD-10-CM | POA: Insufficient documentation

## 2020-02-01 LAB — COMPREHENSIVE METABOLIC PANEL
ALT: 14 U/L (ref 0–44)
AST: 20 U/L (ref 15–41)
Albumin: 3.9 g/dL (ref 3.5–5.0)
Alkaline Phosphatase: 92 U/L (ref 38–126)
Anion gap: 13 (ref 5–15)
BUN: 11 mg/dL (ref 6–20)
CO2: 19 mmol/L — ABNORMAL LOW (ref 22–32)
Calcium: 9.5 mg/dL (ref 8.9–10.3)
Chloride: 102 mmol/L (ref 98–111)
Creatinine, Ser: 0.79 mg/dL (ref 0.44–1.00)
GFR calc Af Amer: >60 mL/min (ref >60)
GFR calc non Af Amer: >60 mL/min (ref >60)
Glucose, Bld: 113 mg/dL — ABNORMAL HIGH (ref 70–99)
Potassium: 2.9 mmol/L — ABNORMAL LOW (ref 3.5–5.1)
Sodium: 134 mmol/L — ABNORMAL LOW (ref 135–145)
Total Bilirubin: 0.9 mg/dL (ref 0.3–1.2)
Total Protein: 8 g/dL (ref 6.5–8.1)

## 2020-02-01 LAB — URINALYSIS, ROUTINE W REFLEX MICROSCOPIC
Bilirubin Urine: NEGATIVE
Glucose, UA: NEGATIVE mg/dL
Hgb urine dipstick: NEGATIVE
Ketones, ur: 80 mg/dL — AB
Nitrite: NEGATIVE
Protein, ur: NEGATIVE mg/dL
Specific Gravity, Urine: 1.016 (ref 1.005–1.030)
pH: 5 (ref 5.0–8.0)

## 2020-02-01 LAB — CBC
HCT: 41 % (ref 36.0–46.0)
Hemoglobin: 13.9 g/dL (ref 12.0–15.0)
MCH: 33.2 pg (ref 26.0–34.0)
MCHC: 33.9 g/dL (ref 30.0–36.0)
MCV: 97.9 fL (ref 80.0–100.0)
Platelets: 244 10*3/uL (ref 150–400)
RBC: 4.19 MIL/uL (ref 3.87–5.11)
RDW: 13.2 % (ref 11.5–15.5)
WBC: 18.7 10*3/uL — ABNORMAL HIGH (ref 4.0–10.5)
nRBC: 0 % (ref 0.0–0.2)

## 2020-02-01 LAB — LIPASE, BLOOD: Lipase: 195 U/L — ABNORMAL HIGH (ref 11–51)

## 2020-02-01 MED ORDER — ONDANSETRON HCL 4 MG/2ML IJ SOLN
4.0000 mg | Freq: Once | INTRAMUSCULAR | Status: AC
  Administered 2020-02-01: 4 mg via INTRAVENOUS
  Filled 2020-02-01: qty 2

## 2020-02-01 MED ORDER — SODIUM CHLORIDE 0.9% FLUSH
3.0000 mL | Freq: Once | INTRAVENOUS | Status: AC
  Administered 2020-02-01: 3 mL via INTRAVENOUS

## 2020-02-01 MED ORDER — DIPHENHYDRAMINE HCL 50 MG/ML IJ SOLN
12.5000 mg | Freq: Once | INTRAMUSCULAR | Status: AC
  Administered 2020-02-01: 12.5 mg via INTRAVENOUS
  Filled 2020-02-01: qty 1

## 2020-02-01 MED ORDER — SODIUM CHLORIDE 0.9 % IV BOLUS
1000.0000 mL | Freq: Once | INTRAVENOUS | Status: AC
  Administered 2020-02-01: 1000 mL via INTRAVENOUS

## 2020-02-01 MED ORDER — FAMOTIDINE IN NACL 20-0.9 MG/50ML-% IV SOLN
20.0000 mg | Freq: Once | INTRAVENOUS | Status: AC
  Administered 2020-02-01: 20 mg via INTRAVENOUS
  Filled 2020-02-01: qty 50

## 2020-02-01 MED ORDER — HYDROMORPHONE HCL 1 MG/ML IJ SOLN
0.5000 mg | Freq: Once | INTRAMUSCULAR | Status: AC
  Administered 2020-02-01: 0.5 mg via INTRAVENOUS
  Filled 2020-02-01: qty 1

## 2020-02-01 MED ORDER — POTASSIUM CHLORIDE CRYS ER 20 MEQ PO TBCR
40.0000 meq | EXTENDED_RELEASE_TABLET | Freq: Once | ORAL | Status: AC
  Administered 2020-02-01: 40 meq via ORAL
  Filled 2020-02-01: qty 2

## 2020-02-01 MED ORDER — HYDROCODONE-ACETAMINOPHEN 5-325 MG PO TABS: 1.0000 | ORAL_TABLET | Freq: Four times a day (QID) | ORAL | 0 refills | Status: AC | PRN

## 2020-02-01 NOTE — Discharge Instructions (Addendum)
Please not drink any alcohol.  This will exacerbate your pancreatitis.  Please drink plenty of water, bland foods.  Please use Zofran as needed for nausea.  Please use Tylenol or ibuprofen for pain.  You may use 600 mg ibuprofen every 6 hours or 1000 mg of Tylenol every 6 hours.  You may choose to alternate between the 2.  This would be most effective.  Not to exceed 4 g of Tylenol within 24 hours.  Not to exceed 3200 mg ibuprofen 24 hours.  I provided you with a small amount of stronger pain medicine for breakthrough pain which you may use at nighttime.

## 2020-02-01 NOTE — ED Provider Notes (Signed)
[facility] EMERGENCY DEPARTMENT Provider Note   CSN: [id number] Arrival date & time: 02/01/20  1343     History Chief Complaint  Patient presents with  . Abdominal Pain  . Flank Pain    Sarah Burch is a 49 y.o. female.  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  Hypertension.  Patient is presented today with epigastric abdominal pain that radiates into her left side and towards her back.  She states that she has had at least 10 episodes of vomiting.  She states it was nonbloody nonbilious.  She states that her symptoms began on Thursday approximately 2 days after she drank 5-7 beers per day during the vacation that lasted 3 days.  She states that she has been alcohol free for approximately 1 year ever since her last flare of pancreatitis.  Patient describes her pain is achy, severe, worse with touch and located in the left upper quadrant of her abdomen radiating to her back.  She denies any fevers, chills.  She states she has been unable to tolerate p.o.  She denies any urinary symptoms, vaginal discharge and states that she is still passing gas she has had of last bowel movement 1 day ago.  She states that she has not been able to eat anything for 2-3 days.  States that she is still passing gas however.  Denies any blood in her stool or any hematemesis.     Past Medical History:  Diagnosis Date  . Chronic GERD   . Hypertension   . Marijuana abuse   . Pancreatitis   . Tobacco abuse     Patient Active Problem List   Diagnosis Date Noted  . Acute cystitis without hematuria   . Protein-calorie malnutrition, severe 04/19/2019  . Hypokalemia 04/17/2019  . Essential hypertension 04/17/2019  . Protein calorie malnutrition (HCC) 04/17/2019  . Marijuana abuse 04/17/2019  . Tobacco abuse 04/17/2019  . Asymptomatic bacteriuria 04/17/2019  . Macrocytosis 04/17/2019  .  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 Hypoalbuminemia 04/17/2019  . 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 Acute pancreatitis 01/26/2019  . GERD (gastroesophageal reflux disease) 01/26/2019  . Acute on chronic pancreatitis (HCC) 01/26/2019    History reviewed. No pertinent surgical history.   OB History   No obstetric history on file.     No family history on file.  Social History   Tobacco Use  . Smoking status: Current Every Day Smoker    Types: Cigarettes  . Smokeless tobacco: Never Used  . Tobacco comment: 4 cig/day  Vaping Use  . Vaping Use: Never used  Substance Use Topics  . Alcohol use: Not Currently  . Drug use: Never    Home Medications Prior to Admission medications   Medication Sig Start Date End Date Taking? Authorizing Provider  acetaminophen (TYLENOL) 325 MG tablet Take 650 mg by mouth every 6 (six) hours as needed for headache (pain).    [provider]  amitriptyline (ELAVIL) 25 MG tablet Take 1 tablet (25 mg total) by mouth at bedtime. 09/05/19   Grayce Sessions, NP  amLODipine (NORVASC) 10 MG tablet Take 1 tablet (10 mg total) by mouth daily. 09/05/19 01/03/20  Grayce Sessions, NP  ELIQUIS 5 MG TABS tablet TAKE 1 TABLET (5 MG TOTAL) BY MOUTH 2 (TWO) TIMES DAILY. 11/18/19   Grayce Sessions, NP  HYDROcodone-acetaminophen (NORCO/VICODIN) 5-325 MG tablet Take 1 tablet by  mouth every 6 (six) hours as needed for severe pain. 02/01/20   Gailen Shelter, PA  Multiple Vitamin (MULTIVITAMIN WITH MINERALS) TABS tablet Take 1 tablet by mouth daily. 04/26/19   Alwyn Ren, MD  ondansetron (ZOFRAN) 4 MG tablet Take 1 tablet (4 mg total) by mouth every 6 (six) hours as needed for nausea. 06/05/19   Grayce Sessions, NP  Pancrelipase, Lip-Prot-Amyl, (ZENPEP) 25000-79000 units CPEP Take 2,500 Units by mouth 3 (three) times daily with meals. 09/05/19   Grayce Sessions, NP  pantoprazole (PROTONIX) 40 MG tablet Take 1 tablet (40 mg total)  by mouth 2 (two) times daily before a meal. 09/05/19   Grayce Sessions, NP    Allergies    Codeine  Review of Systems   Review of Systems  Constitutional: Positive for fatigue. Negative for chills and fever.  HENT: Negative for congestion.   Eyes: Negative for pain.  Respiratory: Negative for cough and shortness of breath.   Cardiovascular: Negative for chest pain and leg swelling.  Gastrointestinal: Positive for abdominal pain, nausea and vomiting. Negative for diarrhea.  Genitourinary: Negative for dysuria.  Musculoskeletal: Negative for myalgias.  Skin: Negative for rash.  Neurological: Negative for dizziness and headaches.    Physical Exam Updated Vital Signs BP (!) 173/109 (BP Location: Right Arm)   Pulse (!) 110   Temp 98.6 F (37 C) (Oral)   Resp (!) 22   Ht 4\' 11"  (1.499 m)   Wt 46.7 kg   SpO2 100%   BMI 20.80 kg/m   Physical Exam Vitals and nursing note reviewed.  Constitutional:      General: She is in acute distress.  HENT:     Head: Normocephalic and atraumatic.     Nose: Nose normal.     Mouth/Throat:     Mouth: Mucous membranes are dry.  Eyes:     General: No scleral icterus. Cardiovascular:     Rate and Rhythm: Regular rhythm. Tachycardia present.     Pulses: Normal pulses.     Heart sounds: Normal heart sounds.  Pulmonary:     Effort: Pulmonary effort is normal. No respiratory distress.     Breath sounds: No wheezing.  Abdominal:     Palpations: Abdomen is soft.     Tenderness: There is abdominal tenderness in the left upper quadrant.     Comments: Soft nonrigid abdomen with tenderness to palpation left upper quadrant.  No guarding or rigidity.  Negative Murphy sign, negative McBurney.  No other notable tenderness.  Musculoskeletal:     Cervical back: Normal range of motion.     Right lower leg: No edema.     Left lower leg: No edema.  Skin:    General: Skin is warm and dry.     Capillary Refill: Capillary refill takes less than 2  seconds.  Neurological:     Mental Status: She is alert. Mental status is at baseline.  Psychiatric:        Mood and Affect: Mood normal.        Behavior: Behavior normal.     ED Results / Procedures / Treatments   Labs (all labs ordered are listed, but only abnormal results are displayed) Labs Reviewed  LIPASE, BLOOD - Abnormal; Notable for the following components:      Result Value   Lipase 195 (*)    All other components within normal limits  COMPREHENSIVE METABOLIC PANEL - Abnormal; Notable for the following components:   Sodium  134 (*)    Potassium 2.9 (*)    CO2 19 (*)    Glucose, Bld 113 (*)    All other components within normal limits  CBC - Abnormal; Notable for the following components:   WBC 18.7 (*)    All other components within normal limits  URINALYSIS, ROUTINE W REFLEX MICROSCOPIC - Abnormal; Notable for the following components:   APPearance HAZY (*)    Ketones, ur 80 (*)    Leukocytes,Ua TRACE (*)    Bacteria, UA RARE (*)    All other components within normal limits    EKG None  Radiology No results found.  Procedures Procedures (including critical care time)  Medications Ordered in ED Medications  sodium chloride flush (NS) 0.9 % injection 3 mL (3 mLs Intravenous Given 02/01/20 1705)  sodium chloride 0.9 % bolus 1,000 mL (1,000 mLs Intravenous Bolus from Bag 02/01/20 1706)  HYDROmorphone (DILAUDID) injection 0.5 mg (0.5 mg Intravenous Given 02/01/20 1707)  diphenhydrAMINE (BENADRYL) injection 12.5 mg (12.5 mg Intravenous Given 02/01/20 1709)  ondansetron (ZOFRAN) injection 4 mg (4 mg Intravenous Given 02/01/20 1707)  famotidine (PEPCID) IVPB 20 mg premix (20 mg Intravenous New Bag/Given (Non-Interop) 02/01/20 1710)  potassium chloride SA (KLOR-CON) CR tablet 40 mEq (40 mEq Oral Given 02/01/20 1711)    ED Course  I have reviewed the triage vital signs and the nursing notes.  Pertinent labs & imaging results that were available during my care of the  patient were reviewed by me and considered in my medical decision making (see chart for details).    MDM Rules/Calculators/A&P                          Patient is 49 year old female with history of alcohol induced pancreatitis presenting today with left upper quadrant domino pain nausea vomiting since Thursday.  She denies any fevers or chills.  Denies any diarrhea hematemesis.  She states that she has no known liver disease.  She was initially tachycardic at a heart rate of 110 and appeared dehydrated dry oral mucosa and mild tachycardia.  Blood pressure was also elevated. Patient has tenderness to palpation left upper quadrant.  Urinalysis is notable for ketones however otherwise unremarkable.  She denies any dysuria, frequency urgency denies any urinary symptoms and has no CVA tenderness on my exam.  Doubt urinary tract infection.  CMP was notable for hypokalemia likely secondary to emesis.  She does appear dehydrated CO2 mildly low likely secondary to mild tachypnea secondary to pain.  Glucose mildly elevated but below the threshold for Ranson criteria.  CBC notable for significant leukocytosis 18.7.  She is afebrile she does not meet SIRS criteria.  Low suspicion for sepsis or intra-abdominal infection/abscess formation.  She has had no complications from her pancreatitis in the past.  Lipase is greater than 3 times the normal limit diagnostic for pancreatitis.  She is without evidence of AKI doubt type II lipase elevation.  Time.  She has significantly improved abdominal pain.  Repeat abdominal exam is without any tenderness at this time.  Her heart rate is now within normal limits and her blood pressure has improved.  She is no longer tachypneic and continues to tolerate p.o.  She has not had any episodes of emesis since ED visit.  We will provide patient with Tylenol, ibuprofen, short course of Norco and Zofran.  She will follow-up with her PCP/gastroenterology.  She is given return  precautions.   Final Clinical  Impression(s) / ED Diagnoses Final diagnoses:  Alcohol-induced acute pancreatitis, unspecified complication status  Epigastric pain    Rx / DC Orders ED Discharge Orders         Ordered    HYDROcodone-acetaminophen (NORCO/VICODIN) 5-325 MG tablet  Every 6 hours PRN     Discontinue  Reprint     02/01/20 1842           Gailen Shelter, PA 02/01/20 Ebony Cargo    Geoffery Lyons, MD 02/01/20 2043

## 2020-02-01 NOTE — ED Triage Notes (Signed)
Pt. Stated, I had stomach pain and going to the left side toward back. I had N/V earlier when it started on Thursday.

## 2020-02-01 NOTE — ED Notes (Signed)
Pt

## 2020-03-16 MED FILL — PANTOPRAZOLE SOD DR 40 MG T: 40 | 30 days supply | Qty: 60 | Fill #4

## 2020-03-16 MED FILL — AMITRIPTYLINE HCL 25 MG TAB: 25 | 30 days supply | Qty: 30 | Fill #4

## 2020-03-16 MED FILL — $ZENPEP 25000-79000 UNIT CA: 25000-79000 | 66 days supply | Qty: 200 | Fill #3

## 2020-03-16 MED FILL — AMLODIPINE BESYLATE 10 MG T: 10 | 30 days supply | Qty: 30 | Fill #4

## 2020-03-30 ENCOUNTER — Other Ambulatory Visit (INDEPENDENT_AMBULATORY_CARE_PROVIDER_SITE_OTHER): Payer: Self-pay | Admitting: Primary Care

## 2020-03-30 MED FILL — AMLODIPINE BESYLATE 10 MG T: 10 | 30 days supply | Qty: 30 | Fill #4

## 2020-03-30 MED FILL — PANTOPRAZOLE SOD DR 40 MG T: 40 | 30 days supply | Qty: 60 | Fill #4

## 2020-03-30 MED FILL — $ZENPEP 25000-79000 UNIT CA: 25000-79000 | 66 days supply | Qty: 200 | Fill #3

## 2020-03-30 MED FILL — AMITRIPTYLINE HCL 25 MG TAB: 25 | 30 days supply | Qty: 30 | Fill #4

## 2020-03-30 NOTE — Telephone Encounter (Signed)
Sent to PCP to refill if appropriate.  

## 2020-04-01 NOTE — Telephone Encounter (Signed)
Left patient a message to return call to office

## 2020-05-26 MED FILL — AMITRIPTYLINE HCL 25 MG TAB: 25 | 30 days supply | Qty: 30 | Fill #5

## 2020-05-26 MED FILL — AMLODIPINE BESYLATE 10 MG T: 10 | 30 days supply | Qty: 30 | Fill #5

## 2020-05-26 MED FILL — PANTOPRAZOLE SOD DR 40 MG T: 40 | 30 days supply | Qty: 60 | Fill #5

## 2020-08-03 ENCOUNTER — Other Ambulatory Visit: Payer: Self-pay | Admitting: Primary Care

## 2020-08-03 ENCOUNTER — Other Ambulatory Visit (INDEPENDENT_AMBULATORY_CARE_PROVIDER_SITE_OTHER): Payer: Self-pay | Admitting: Primary Care

## 2020-08-03 DIAGNOSIS — I10 Essential (primary) hypertension: Secondary | ICD-10-CM

## 2020-08-03 MED ORDER — AMLODIPINE BESYLATE 10 MG PO TABS: 10.0000 mg | ORAL_TABLET | Freq: Every day | ORAL | 0 refills | Status: AC

## 2020-08-03 MED ORDER — AMITRIPTYLINE HCL 25 MG PO TABS: 25.0000 mg | ORAL_TABLET | Freq: Every day | ORAL | 0 refills | Status: AC

## 2020-08-03 MED ORDER — PANTOPRAZOLE SODIUM 40 MG PO TBEC: 40.0000 mg | DELAYED_RELEASE_TABLET | Freq: Two times a day (BID) | ORAL | 0 refills | Status: AC

## 2020-08-03 MED ORDER — ZENPEP 25000-79000 UNITS PO CPEP: 2500.0000 [IU] | ORAL_CAPSULE | Freq: Three times a day (TID) | ORAL | 0 refills | Status: AC

## 2020-08-03 NOTE — Telephone Encounter (Signed)
Medication Refill - Medication:  Pancrelipase, Lip-Prot-Amyl, (ZENPEP) 25000-79000 units CPEP ,  amLODipine (NORVASC) 10 MG tablet ,  pantoprazole (PROTONIX) 40 MG tablet ,  amitriptyline (ELAVIL) 25 MG tablet .  Patient unsure if she should be taken ELIQUIS 5 MG TABS tablet   Has the patient contacted their pharmacy? Yes.    (Agent: If yes, when and what did the pharmacy advise?) Pharmacy advised patient appointment is needed. PCP first available is not until 08/19/2020 Patient requesting a courtesy refill until appointment  Preferred Pharmacy (with phone number or street name):   [facility] - [place], [place] - [address] Phone:  [phone]  Fax:  [phone]       Agent: Please be advised that RX refills may take up to 3 business days. We ask that you follow-up with your pharmacy.

## 2020-08-03 NOTE — Telephone Encounter (Signed)
Please see previous encounter

## 2020-08-04 MED FILL — AMITRIPTYLINE HCL 25 MG TAB: 25 | 15 days supply | Qty: 15 | Fill #0

## 2020-08-04 MED FILL — $ZENPEP 25000-79000 UNIT CA: 25000-79000 | 33 days supply | Qty: 100 | Fill #0

## 2020-08-04 MED FILL — PANTOPRAZOLE SOD DR 40 MG T: 40 | 30 days supply | Qty: 60 | Fill #0

## 2020-08-04 MED FILL — AMLODIPINE BESYLATE 10 MG T: 10 | 15 days supply | Qty: 15 | Fill #0

## 2020-08-19 ENCOUNTER — Ambulatory Visit (INDEPENDENT_AMBULATORY_CARE_PROVIDER_SITE_OTHER): Payer: Self-pay | Admitting: Primary Care

## 2020-08-31 ENCOUNTER — Ambulatory Visit (INDEPENDENT_AMBULATORY_CARE_PROVIDER_SITE_OTHER): Payer: Self-pay | Admitting: Primary Care

## 2024-07-23 ENCOUNTER — Other Ambulatory Visit: Payer: Self-pay | Admitting: *Deleted

## 2024-07-23 NOTE — Progress Notes (Signed)
 Seen on OR. Reports having an upper endo and colonoscopy remotely while incarcerated.  She was told she had 2 polyps and she would need a colonoscopy in 7-10 years. The easiest way to get an opinion would be to make a GI referral.  Will discuss with SP and team and plan accordingly
